# Patient Record
Sex: Female | Born: 1985 | Hispanic: No | Marital: Married | Smoking: Never smoker
Health system: Southern US, Community
[De-identification: ages and names within clinical notes are randomized; demographics above are authoritative.]

## PROBLEM LIST (undated history)

## (undated) DIAGNOSIS — K8020 Calculus of gallbladder without cholecystitis without obstruction: Secondary | ICD-10-CM

## (undated) DIAGNOSIS — K859 Acute pancreatitis without necrosis or infection, unspecified: Secondary | ICD-10-CM

## (undated) HISTORY — DX: Calculus of gallbladder without cholecystitis without obstruction: K80.20

## (undated) HISTORY — DX: Acute pancreatitis without necrosis or infection, unspecified: K85.90

## (undated) HISTORY — PX: REFRACTIVE SURGERY: SHX103

---

## 2008-01-13 DIAGNOSIS — K8020 Calculus of gallbladder without cholecystitis without obstruction: Secondary | ICD-10-CM

## 2008-01-13 DIAGNOSIS — K859 Acute pancreatitis without necrosis or infection, unspecified: Secondary | ICD-10-CM

## 2008-01-13 HISTORY — DX: Calculus of gallbladder without cholecystitis without obstruction: K80.20

## 2008-01-13 HISTORY — DX: Acute pancreatitis without necrosis or infection, unspecified: K85.90

## 2008-01-13 HISTORY — PX: CHOLECYSTECTOMY: SHX55

## 2012-08-27 ENCOUNTER — Encounter: Payer: Self-pay | Admitting: Family Medicine

## 2012-08-27 ENCOUNTER — Ambulatory Visit (INDEPENDENT_AMBULATORY_CARE_PROVIDER_SITE_OTHER): Payer: BC Managed Care – PPO | Admitting: Physician Assistant

## 2012-08-27 VITALS — BP 143/97 | HR 87 | Temp 98.6°F | Resp 16 | Ht 62.5 in | Wt 176.0 lb

## 2012-08-27 DIAGNOSIS — H1010 Acute atopic conjunctivitis, unspecified eye: Secondary | ICD-10-CM

## 2012-08-27 DIAGNOSIS — H1045 Other chronic allergic conjunctivitis: Secondary | ICD-10-CM

## 2012-08-27 MED ORDER — AZELASTINE HCL 0.05 % OP SOLN: 1.0000 [drp] | Freq: Two times a day (BID) | OPHTHALMIC | Status: DC

## 2012-08-27 NOTE — Progress Notes (Signed)
  Subjective:    Patient ID: Deborah Stokes, female    DOB: 1986-04-22, 27 y.o.   MRN: [id number]  HPI 27 yr old female presents with Deborah Stokes, Deborah Stokes X 4 days.  It all started 4 nights ago when her L eye started being Deborah and red.  That has waxed and waned and the last 2 mornings she has awakened to her Stokes being swollen.  She has not used any new soaps, detergents, make up, creams, lotions, etc. She denies pain, but she does have a slight headache.  No vision change.  No FB exposure.  Review of Systems  All other systems reviewed and are negative.       Objective:   Physical Exam  Nursing note and vitals reviewed. Constitutional: She is oriented to person, place, and time. She appears well-developed and well-nourished.  HENT:  Head: Normocephalic and atraumatic.  Right Ear: External ear normal.  Left Ear: External ear normal.  Mouth/Throat: Oropharynx is clear and moist. No oropharyngeal exudate.       No soft tissue swelling of the palate. No preauricular nodes.  Upper eyelids are edematous.  No TTP or sign of orbital cellulitis.  Stokes: EOM are normal. Pupils are equal, round, and reactive to light. Right eye exhibits no discharge. Left eye exhibits no discharge.       Lateral conjunctivae B are erythematous with bulbar and palpebral chemosis.  Fundi benign.  No drainage.  Neck: Normal range of motion. Neck supple.  Cardiovascular: Normal rate and normal heart sounds.   Pulmonary/Chest: Effort normal and breath sounds normal.  Neurological: She is alert and oriented to person, place, and time.  Skin: Skin is warm and dry.  Psychiatric: She has a normal mood and affect. Her behavior is normal.          Assessment & Plan:  Allergic conjunctivitis-unknown allergen.  Start Zyrtec 10mg  1 daily for the next 2 weeks.  Also, start optivar. RTC if not improving in 2 days, sooner if worse.

## 2012-08-27 NOTE — Patient Instructions (Signed)
Cool compresses for comfort

## 2012-10-23 ENCOUNTER — Encounter: Payer: Self-pay | Admitting: Family Medicine

## 2012-10-23 ENCOUNTER — Ambulatory Visit (INDEPENDENT_AMBULATORY_CARE_PROVIDER_SITE_OTHER): Payer: BC Managed Care – PPO | Admitting: Family Medicine

## 2012-10-23 VITALS — BP 124/70 | HR 81 | Temp 98.5°F | Ht 62.25 in | Wt 177.8 lb

## 2012-10-23 DIAGNOSIS — R635 Abnormal weight gain: Secondary | ICD-10-CM

## 2012-10-23 DIAGNOSIS — T7840XA Allergy, unspecified, initial encounter: Secondary | ICD-10-CM

## 2012-10-23 LAB — CBC WITH DIFFERENTIAL/PLATELET
Basophils Absolute: 0 10*3/uL (ref 0.0–0.1)
Eosinophils Absolute: 0.1 10*3/uL (ref 0.0–0.7)
HCT: 41.8 % (ref 36.0–46.0)
Lymphs Abs: 2.3 10*3/uL (ref 0.7–4.0)
MCHC: 34 g/dL (ref 30.0–36.0)
MCV: 87.6 fl (ref 78.0–100.0)
Monocytes Absolute: 0.3 10*3/uL (ref 0.1–1.0)
Neutrophils Relative %: 60.9 % (ref 43.0–77.0)
Platelets: 305 10*3/uL (ref 150.0–400.0)
RDW: 12.5 % (ref 11.5–14.6)

## 2012-10-23 LAB — BASIC METABOLIC PANEL
Calcium: 9.1 mg/dL (ref 8.4–10.5)
GFR: 86.69 mL/min (ref >60.00)
Glucose, Bld: 95 mg/dL (ref 70–99)
Potassium: 4 mEq/L (ref 3.5–5.1)
Sodium: 136 mEq/L (ref 135–145)

## 2012-10-23 LAB — HEPATIC FUNCTION PANEL
Albumin: 3.9 g/dL (ref 3.5–5.2)
Alkaline Phosphatase: 44 U/L (ref 39–117)
Total Bilirubin: 0.5 mg/dL (ref 0.3–1.2)

## 2012-10-23 LAB — LIPID PANEL
HDL: 38.1 mg/dL — ABNORMAL LOW (ref >39.00)
Triglycerides: 182 mg/dL — ABNORMAL HIGH (ref 0.0–149.0)
VLDL: 36.4 mg/dL (ref 0.0–40.0)

## 2012-10-23 NOTE — Progress Notes (Signed)
  Subjective:    Patient ID: Deborah Stokes, female    DOB: 01-Feb-1986, 27 y.o.   MRN: [id number]  HPI New to establish.  Previous PCP- Dr Bryson Ha Iona Hansen)  Weight gain- pt reports that her weight continues to climb despite exercising.  Is attempting to eat healthy.  Maternal aunt w/ thyroid problem.  Exercising 4 days/week for 1 hr.  Drinking smoothie every morning, eating out daily for lunch, dinner is very inconsistent- either going out or eating lunch leftovers.  Sleeping well.  Denies increased stress recently.  Hives- had jello shot in Oct and 30 minutes after developed hives on arms, 2 days later had hives on arms, chest, face, thighs.  Eyes had swollen shut.  Ended up at [facility] to get a steroid shot.  dx'd w/ 'severe allergic rxn'.  January was in a meeting and had excessive tearing of eye w/ swelling.  Was dx'd w/ allergic conjunctivitis and started on Zyrtec and steroid eye drops.  sxs improved.  Denies change in makeup, lotion, soap, other facial products.  No new or different foods.   Review of Systems For ROS see HPI     Objective:   Physical Exam  Vitals reviewed. Constitutional: She is oriented to person, place, and time. She appears well-developed and well-nourished. No distress.  HENT:  Head: Normocephalic and atraumatic.  Eyes: Conjunctivae and EOM are normal. Pupils are equal, round, and reactive to light.  Neck: Normal range of motion. Neck supple. No thyromegaly present.  Cardiovascular: Normal rate, regular rhythm, normal heart sounds and intact distal pulses.   No murmur heard. Pulmonary/Chest: Effort normal and breath sounds normal. No respiratory distress.  Abdominal: Soft. She exhibits no distension. There is no tenderness.  Musculoskeletal: She exhibits no edema.  Lymphadenopathy:    She has no cervical adenopathy.  Neurological: She is alert and oriented to person, place, and time.  Skin: Skin is warm and dry.  Psychiatric: She has a normal mood and affect. Her  behavior is normal.          Assessment & Plan:

## 2012-10-23 NOTE — Patient Instructions (Addendum)
Schedule your complete physical w/ pap at your convenience We'll notify you of your lab results and make any changes if needed Keep up the good work on regular exercise Try and make healthy food choices- this will take some advanced planning Consider weight watchers or [place] to help hold you accountable in your eating [place] [employer], the [employer] (multiple locations), [employer] all have adult sports leagues We'll call you with your allergy appointment Make sure you have Benadryl on hand in case of a reaction Call with any questions or concerns Think of [place] as your home base Welcome!  We're glad to have you!!!

## 2012-10-27 NOTE — Assessment & Plan Note (Signed)
New.  Pt exercising regularly but very poor eating habits.  Encouraged use of smart phone apps to track intake and do a better job of planning meals to avoid grabbing take out.  Check labs to r/o thyroid abnormality and risk stratify.  Pt expressed understanding and is in agreement w/ plan.

## 2012-10-27 NOTE — Assessment & Plan Note (Addendum)
New.  Pt w/out current sxs or hives but due to severity of rxn will refer to allergy for complete evaluation and likely skin testing.  Encouraged pt to always have benadryl on hand.  Zyrtec/claritin daily.  Will follow.

## 2012-10-28 LAB — VITAMIN D 1,25 DIHYDROXY: Vitamin D 1, 25 (OH)2 Total: 77 pg/mL — ABNORMAL HIGH (ref 18–72)

## 2012-11-05 ENCOUNTER — Telehealth: Payer: Self-pay | Admitting: Family Medicine

## 2012-11-05 NOTE — Telephone Encounter (Signed)
Pt returned your call.  

## 2012-11-05 NOTE — Telephone Encounter (Signed)
Patient states just received a bill from [facility] for a Vit D level. She states she was not aware we were testing for this and she does not know why we would have done this. Patient would like a call back.

## 2012-11-28 ENCOUNTER — Encounter: Payer: Self-pay | Admitting: Family Medicine

## 2012-12-26 ENCOUNTER — Encounter: Payer: Self-pay | Admitting: Family Medicine

## 2013-01-30 ENCOUNTER — Other Ambulatory Visit (HOSPITAL_COMMUNITY)
Admission: RE | Admit: 2013-01-30 | Discharge: 2013-01-30 | Disposition: A | Discharge status: Home / Self Care | Payer: BC Managed Care – PPO | Source: Ambulatory Visit | Attending: Family Medicine | Admitting: Family Medicine

## 2013-01-30 ENCOUNTER — Ambulatory Visit (INDEPENDENT_AMBULATORY_CARE_PROVIDER_SITE_OTHER): Payer: BC Managed Care – PPO | Admitting: Family Medicine

## 2013-01-30 ENCOUNTER — Encounter: Payer: Self-pay | Admitting: Family Medicine

## 2013-01-30 VITALS — BP 148/90 | HR 88 | Temp 98.6°F | Ht 62.25 in | Wt 182.8 lb

## 2013-01-30 DIAGNOSIS — R109 Unspecified abdominal pain: Secondary | ICD-10-CM

## 2013-01-30 DIAGNOSIS — Z01419 Encounter for gynecological examination (general) (routine) without abnormal findings: Secondary | ICD-10-CM | POA: Insufficient documentation

## 2013-01-30 DIAGNOSIS — Z1151 Encounter for screening for human papillomavirus (HPV): Secondary | ICD-10-CM | POA: Insufficient documentation

## 2013-01-30 DIAGNOSIS — R1084 Generalized abdominal pain: Secondary | ICD-10-CM | POA: Insufficient documentation

## 2013-01-30 DIAGNOSIS — Z124 Encounter for screening for malignant neoplasm of cervix: Secondary | ICD-10-CM | POA: Insufficient documentation

## 2013-01-30 DIAGNOSIS — Z9889 Other specified postprocedural states: Secondary | ICD-10-CM

## 2013-01-30 NOTE — Patient Instructions (Addendum)
Follow up in 1 year or as needed Consider weight watchers or similar diet program We'll call you with your GI appt for the abdominal pain Call with any questions or concerns Have a great summer!!!

## 2013-01-30 NOTE — Progress Notes (Signed)
  Subjective:    Patient ID: Cy Blamer, female    DOB: 20-Apr-1986, 27 y.o.   MRN: [id number]  HPI CPE- due for pap.  abd pain- occurs after eating.  Will lead to diarrhea.  S/p cholecystectomy due to gallstone pancreatitis.  Doesn't depend on type of food eaten.     Review of Systems Patient reports no vision/ hearing changes, adenopathy, fever, weight change,  persistant/recurrent hoarseness , swallowing issues, chest pain, palpitations, edema, persistant/recurrent cough, hemoptysis, dyspnea (rest/exertional/paroxysmal nocturnal), gastrointestinal bleeding (melena, rectal bleeding), significant heartburn, bowel changes, GU symptoms (dysuria, hematuria, incontinence), Gyn symptoms (abnormal  bleeding, pain),  syncope, focal weakness, memory loss, numbness & tingling, skin/hair/nail changes, abnormal bruising or bleeding, anxiety, or depression.     Objective:   Physical Exam  General Appearance:    Alert, cooperative, no distress, appears stated age  Head:    Normocephalic, without obvious abnormality, atraumatic  Eyes:    PERRL, conjunctiva/corneas clear, EOM's intact, fundi    benign, both eyes  Ears:    Normal TM's and external ear canals, both ears  Nose:   Nares normal, septum midline, mucosa normal, no drainage    or sinus tenderness  Throat:   Lips, mucosa, and tongue normal; teeth and gums normal  Neck:   Supple, symmetrical, trachea midline, no adenopathy;    Thyroid: no enlargement/tenderness/nodules  Back:     Symmetric, no curvature, ROM normal, no CVA tenderness  Lungs:     Clear to auscultation bilaterally, respirations unlabored  Chest Wall:    No tenderness or deformity   Heart:    Regular rate and rhythm, S1 and S2 normal, no murmur, rub   or gallop  Breast Exam:    No tenderness, masses, or nipple abnormality  Abdomen:     Soft, non-tender, bowel sounds active all four quadrants,    no masses, no organomegaly  Genitalia:    External genitalia normal, cervix normal  in appearance, no CMT, uterus in normal size and position, adnexa w/out mass or tenderness, mucosa pink and moist, no lesions or discharge present  Rectal:    Normal external appearance  Extremities:   Extremities normal, atraumatic, no cyanosis or edema  Pulses:   2+ and symmetric all extremities  Skin:   Skin color, texture, turgor normal, no rashes or lesions  Lymph nodes:   Cervical, supraclavicular, and axillary nodes normal  Neurologic:   CNII-XII intact, normal strength, sensation and reflexes    throughout          Assessment & Plan:

## 2013-01-31 ENCOUNTER — Encounter: Payer: Self-pay | Admitting: Gastroenterology

## 2013-01-31 NOTE — Assessment & Plan Note (Signed)
New to provider, ongoing for pt.  Very bothersome.  Occurs w/ eating but no specific triggers.  Has not seen GI.  Already has had gallbladder removed.  Refer to GI for complete eval and tx.  Pt expressed understanding and is in agreement w/ plan.

## 2013-01-31 NOTE — Assessment & Plan Note (Signed)
Pap collected. 

## 2013-01-31 NOTE — Assessment & Plan Note (Signed)
Pt's PE WNL w/ exception of obesity.  Reviewed labs from last visit.  Encouraged weight loss.  Anticipatory guidance provided.

## 2013-02-04 ENCOUNTER — Encounter: Payer: Self-pay | Admitting: *Deleted

## 2013-02-06 ENCOUNTER — Encounter: Payer: Self-pay | Admitting: *Deleted

## 2013-02-24 ENCOUNTER — Ambulatory Visit: Payer: BC Managed Care – PPO | Admitting: Gastroenterology

## 2013-03-03 ENCOUNTER — Encounter: Payer: Self-pay | Admitting: Internal Medicine

## 2013-03-06 ENCOUNTER — Other Ambulatory Visit (INDEPENDENT_AMBULATORY_CARE_PROVIDER_SITE_OTHER): Payer: BC Managed Care – PPO

## 2013-03-06 ENCOUNTER — Encounter: Payer: Self-pay | Admitting: Internal Medicine

## 2013-03-06 ENCOUNTER — Ambulatory Visit (INDEPENDENT_AMBULATORY_CARE_PROVIDER_SITE_OTHER): Payer: BC Managed Care – PPO | Admitting: Internal Medicine

## 2013-03-06 VITALS — BP 126/98 | HR 84 | Ht 62.0 in | Wt 180.0 lb

## 2013-03-06 DIAGNOSIS — R195 Other fecal abnormalities: Secondary | ICD-10-CM

## 2013-03-06 DIAGNOSIS — K589 Irritable bowel syndrome without diarrhea: Secondary | ICD-10-CM

## 2013-03-06 LAB — IGA: IgA: 260 mg/dL (ref 68–378)

## 2013-03-06 MED ORDER — LACTASE 3000 UNITS PO TABS: 1.0000 | ORAL_TABLET | Freq: Three times a day (TID) | ORAL | Status: DC

## 2013-03-06 MED ORDER — RESTORA PO CAPS: 1.0000 | ORAL_CAPSULE | Freq: Every day | ORAL | Status: DC

## 2013-03-06 MED ORDER — HYOSCYAMINE SULFATE 0.125 MG SL SUBL: 0.1250 mg | SUBLINGUAL_TABLET | SUBLINGUAL | Status: DC | PRN

## 2013-03-06 NOTE — Patient Instructions (Addendum)
Your physician has requested that you go to the basement for the following lab work before leaving today: Celiac panel  We have sent the following medications to your pharmacy for you to pick up at your convenience: Levsin as needed for pain. You can purchase lactaid over the counter to take with every meal that contains lactose.   We have given you samples of Restora. This puts good bacteria back into your intestines. You should take 1 capsule by mouth once daily. If this works well for you, it can be purchased over the counter.  Dr. Rhea Belton recommends you keep a food diary and follow up with him in office in 4-6  Weeks                                               We are excited to introduce MyChart, a new best-in-class service that provides you online access to important information in your electronic medical record. We want to make it easier for you to view your health information - all in one secure location - when and where you need it. We expect MyChart will enhance the quality of care and service we provide.  When you register for MyChart, you can:    View your test results.    Request appointments and receive appointment reminders via email.    Request medication renewals.    View your medical history, allergies, medications and immunizations.    Communicate with your physician's office through a password-protected site.    Conveniently print information such as your medication lists.  To find out if MyChart is right for you, please talk to a member of our clinical staff today. We will gladly answer your questions about this free health and wellness tool.  If you are age 50 or older and want a member of your family to have access to your record, you must provide written consent by completing a proxy form available at our office. Please speak to our clinical staff about guidelines regarding accounts for patients younger than age 49.  As you activate your MyChart account and need  any technical assistance, please call the MyChart technical support line at [phone]) or email your question to [email or web address]. If you email your question(s), please include your name, a return phone number and the best time to reach you.  If you have non-urgent health-related questions, you can send a message to our office through MyChart at [email or web address]. If you have a medical emergency, call 911.  Thank you for using MyChart as your new health and wellness resource!   MyChart licensed from [employer],  [id number]. Patents Pending.

## 2013-03-06 NOTE — Progress Notes (Signed)
Patient ID: Deborah Stokes, female   DOB: Sep 02, 1985, 27 y.o.   MRN: [id number] HPI: Deborah Stokes is a 27 year old female with a past medical history of gallstones, kbyte gallstone pancreatitis status post ERCP and cholecystectomy in 2009 in [place] who is seen in consultation at the request of Dr. Beverely Low for evaluation of postprandial loose stools and abdominal pain. The patient is alone today. She reports long-standing history of postprandial loose stools, oftentimes urgent. She has mid abdominal discomfort which is relieved defecation. She reports this is been present for 10 years or "as long as I can remember". This tends to be worse with stress. She knows that certain foods such as milk can make her symptoms worse, but also greasy foods such as pizza can worsen her symptoms. She does not have nausea or vomiting. She does not have heartburn. She's had no blood in her stool or melena. She usually has bowel movements after eating on average 2-3 times a day but can occur 5-6 times per day. She reports this is somewhat troublesome at work and can be embarrassing. No weight loss. No family history of celiac disease, IBD, or GI tract malignancy. She is not currently taking any prescription medications and she doesn't take any over-the-counter meds either  Past Medical History  Diagnosis Date  . Gallstones 01/2008  . Pancreatitis 01/2008    Past Surgical History  Procedure Laterality Date  . Refractive surgery Bilateral   . Cholecystectomy  01/2008    Current Outpatient Prescriptions  Medication Sig Dispense Refill  . hyoscyamine (LEVSIN SL) 0.125 MG SL tablet Place 1 tablet (0.125 mg total) under the tongue every 4 (four) hours as needed for cramping.  30 tablet  0  . lactase (LACTAID) 3000 UNITS tablet Take 1 tablet (3,000 Units total) by mouth 3 (three) times daily with meals.      . Probiotic Product (RESTORA) CAPS Take 1 capsule by mouth daily.       No current facility-administered  medications for this visit.    No Known Allergies  Family History  Problem Relation Age of Onset  . Throat cancer Maternal Grandfather   . Hypertension Mother   . Hypertension Father   . Arthritis Maternal Grandmother   . Stroke Maternal Grandmother   . Hypertension Maternal Grandmother   . Diabetes Maternal Grandmother     History  Substance Use Topics  . Smoking status: Never Smoker   . Smokeless tobacco: Never Used  . 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 No distress. HEENT: Normocephalic and atraumatic. Oropharynx is clear and moist. No oropharyngeal exudate. Conjunctivae are normal.  No scleral icterus. Neck: Neck supple. Trachea midline. Cardiovascular: Normal rate, regular rhythm and intact distal pulses. No M/R/G Pulmonary/chest: Effort normal and breath sounds normal. No wheezing, rales or rhonchi. Abdominal: Soft, nontender, nondistended. Bowel sounds active throughout. There are no masses palpable. No hepatosplenomegaly. Extremities: no clubbing, cyanosis, or edema Lymphadenopathy: No cervical adenopathy noted. Neurological: Alert and oriented to person place and time. Skin: Skin is warm and dry. No rashes noted. Psychiatric: Normal mood and affect. Behavior is normal.  RELEVANT LABS AND IMAGING: CBC  Component Value Date/Time   WBC 7.1 10/23/2012 0908   RBC 4.77 10/23/2012 0908   HGB 14.2 10/23/2012 0908   HCT 41.8 10/23/2012 0908   PLT 305.0 10/23/2012 0908   MCV 87.6 10/23/2012 0908   MCHC 34.0 10/23/2012 0908   RDW 12.5 10/23/2012 0908   LYMPHSABS 2.3 10/23/2012 0908   MONOABS 0.3 10/23/2012 0908   EOSABS 0.1 10/23/2012 0908   BASOSABS 0.0 10/23/2012 0908     CMP     Component Value Date/Time   NA 136 10/23/2012 0908   K 4.0 10/23/2012 0908   CL 106 10/23/2012 0908   CO2 24 10/23/2012 0908   GLUCOSE 95 10/23/2012 0908   BUN 15 10/23/2012 0908   CREATININE 0.8 10/23/2012 0908   CALCIUM 9.1 10/23/2012 0908   PROT 7.5 10/23/2012 0908   ALBUMIN 3.9 10/23/2012 0908   AST 19 10/23/2012 0908   ALT 41* 10/23/2012 0908   ALKPHOS 44 10/23/2012 0908   BILITOT 0.5 10/23/2012 0908   TSH - normal  ASSESSMENT/PLAN: 27 year old female with a past medical history of gallstones, kbyte gallstone pancreatitis status post ERCP and cholecystectomy in 2009 in [place] who is seen in consultation at the request of Dr. Beverely Low for evaluation of postprandial loose stools and abdominal pain.   1.  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 We discussed this today. Previously her blood count, metabolic panel, and thyroid function labs were normal. I would like to check a celiac panel today. I would like her to keep a food diary to try to determine which foods trigger her symptoms most. It sounds like she may have a lactose intolerance and I have recommended over-the-counter Lactaid one to 2 tablets before a lactose containing meal. I will have her start probiotic (Restora) and also Benefiber to help bulk her stools. Finally I will give her prescription for Levsin to be used as needed and as directed for crampy abdominal pain.  I like to see her back in 4-6 weeks to assess her response. She may be a candidate for Lotronex.  We will discuss this at followup if not improving.  Endoscopy is not felt to be indicated nor helpful at this time given her lack of dyspeptic, reflux symptoms

## 2013-03-10 ENCOUNTER — Telehealth: Payer: Self-pay | Admitting: Gastroenterology

## 2013-03-10 NOTE — Telephone Encounter (Signed)
Message copied by Richardo Hanks on Mon Mar 10, 2013  9:21 AM ------      Message from: Beverley Fiedler      Created: Sun Mar 09, 2013 11:05 PM       Celiac panel is negative, so celiac disease very very unlikely ------

## 2013-03-10 NOTE — Telephone Encounter (Signed)
lvm for pt to call me back for lab results 

## 2013-03-10 NOTE — Telephone Encounter (Signed)
Message copied by Richardo Hanks on Mon Mar 10, 2013  1:45 PM ------      Message from: Mckinley Jewel, AMY L      Created: Mon Mar 10, 2013 12:25 PM       Pt said you can reach her at same number and you are welcome to leave a msg on her vm ------

## 2013-03-10 NOTE — Telephone Encounter (Signed)
lvm for pt telling her celiac panel came back negative, and per Dr. Rhea Belton celiac disease is very unlikely. She can call me back if she has any questions.

## 2013-09-05 ENCOUNTER — Encounter: Payer: Self-pay | Admitting: Family Medicine

## 2013-09-05 ENCOUNTER — Ambulatory Visit (INDEPENDENT_AMBULATORY_CARE_PROVIDER_SITE_OTHER): Payer: BC Managed Care – PPO | Admitting: Family Medicine

## 2013-09-05 VITALS — BP 120/86 | HR 88 | Temp 98.3°F | Resp 16 | Wt 181.0 lb

## 2013-09-05 DIAGNOSIS — K219 Gastro-esophageal reflux disease without esophagitis: Secondary | ICD-10-CM | POA: Insufficient documentation

## 2013-09-05 DIAGNOSIS — J309 Allergic rhinitis, unspecified: Secondary | ICD-10-CM

## 2013-09-05 MED ORDER — OMEPRAZOLE 20 MG PO CPDR: 20.0000 mg | DELAYED_RELEASE_CAPSULE | Freq: Every day | ORAL | Status: AC

## 2013-09-05 NOTE — Patient Instructions (Signed)
Follow up as needed Start the Omeprazole 20mg  daily to decrease the acid reflux Start the Zyrtec daily to decrease the post-nasal Drink plenty of fluids Call with any questions or concerns Hang in there!!!

## 2013-09-05 NOTE — Progress Notes (Signed)
Pre visit review using our clinic review tool, if applicable. No additional management support is needed unless otherwise documented below in the visit note. 

## 2013-09-05 NOTE — Assessment & Plan Note (Signed)
New.  Pt is experiencing sour brash and ongoing cough.  Start low dose PPI and see if both sxs improve.  Pt expressed understanding and is in agreement w/ plan.

## 2013-09-05 NOTE — Assessment & Plan Note (Signed)
New.  Likely contributing to pt's ongoing cough.  Start OTC antihistamine.  Reviewed supportive care and red flags that should prompt return.  Pt expressed understanding and is in agreement w/ plan.

## 2013-09-05 NOTE — Progress Notes (Signed)
   Subjective:    Patient ID: Deborah Stokes, female    DOB: [birth year], 28 y.o.   MRN: [id number]  HPI URI- 'lingering nasal stuff and cough since December'.  Having sore throat in AM.  Cough is dry.  Will occasionally have sour brash taste.  No abd pain.  Some sinus pressure and ear discomfort.  No fevers.  No known sick contacts.   Review of Systems For ROS see HPI     Objective:   Physical Exam  Vitals reviewed. Constitutional: She appears well-developed and well-nourished. No distress.  HENT:  Head: Normocephalic and atraumatic.  Right Ear: Tympanic membrane normal.  Left Ear: Tympanic membrane normal.  Nose: Mucosal edema and rhinorrhea present. Right sinus exhibits no maxillary sinus tenderness and no frontal sinus tenderness. Left sinus exhibits no maxillary sinus tenderness and no frontal sinus tenderness.  Mouth/Throat: Mucous membranes are normal. Posterior oropharyngeal erythema (w/ PND) present.  Eyes: Conjunctivae and EOM are normal. Pupils are equal, round, and reactive to light.  Neck: Normal range of motion. Neck supple.  Cardiovascular: Normal rate, regular rhythm and normal heart sounds.   Pulmonary/Chest: Effort normal and breath sounds normal. No respiratory distress. She has no wheezes. She has no rales.  Lymphadenopathy:    She has no cervical adenopathy.          Assessment & Plan:

## 2013-10-06 ENCOUNTER — Ambulatory Visit (INDEPENDENT_AMBULATORY_CARE_PROVIDER_SITE_OTHER): Payer: BC Managed Care – PPO | Admitting: Physician Assistant

## 2013-10-06 ENCOUNTER — Encounter: Payer: Self-pay | Admitting: Physician Assistant

## 2013-10-06 VITALS — BP 124/77 | HR 103 | Temp 98.5°F | Wt 186.0 lb

## 2013-10-06 DIAGNOSIS — B9789 Other viral agents as the cause of diseases classified elsewhere: Principal | ICD-10-CM

## 2013-10-06 DIAGNOSIS — J069 Acute upper respiratory infection, unspecified: Secondary | ICD-10-CM

## 2013-10-06 MED ORDER — HYDROCOD POLST-CHLORPHEN POLST 10-8 MG/5ML PO LQCR: 5.0000 mL | Freq: Two times a day (BID) | ORAL | Status: DC | PRN

## 2013-10-06 NOTE — Assessment & Plan Note (Signed)
Rx Tussionex. Increase fluid intake.  Rest.  Saline nasal spray. Mucinex. Humidifier in bedroom.  Please call or return to clinic if symptoms do not continue to improve over the next 1-2 days.

## 2013-10-06 NOTE — Progress Notes (Signed)
Patient presents to clinic today c/o 3 days of nasal congestion, cough, headache, scratchy throat and post-nasal drip.  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  Patient states she sat next to a woman who was sick and coughing all day.  Patient states Saturday morning she woke up with a mild cough and scratchy throat.  Late Saturday night, patient endorses running a low-grade fever.  Sunday she suffered with body aches and continued cough.  Endorses fever of 100.2 that went away with medication.  Patient currently has mild, scratchy throat and dry cough but states her symptoms are much improved from yesterday.  Denies fever, chills, aches, difficulty swallowing, sob or wheezing.  Denies sinus pain, ear pain or tooth pain.    Past Medical History  Diagnosis Date  . Gallstones 01/2008  . Pancreatitis 01/2008    Current Outpatient Prescriptions on File Prior to Visit  Medication Sig Dispense Refill  . omeprazole (PRILOSEC) 20 MG capsule Take 1 capsule (20 mg total) by mouth daily.  30 capsule  3   No current facility-administered medications on file prior to visit.    No Known Allergies  Family History  Problem Relation Age of Onset  . Throat cancer Maternal Grandfather   . Hypertension Mother   . Hypertension Father   . Arthritis Maternal Grandmother   . Stroke Maternal Grandmother   . Hypertension Maternal Grandmother   . Diabetes Maternal Grandmother     History   Social History  . Marital Status: Unknown    Spouse Name: N/A    Number of Children: 0  . Years of Education: N/A   Occupational History  . clinical counselor [employer]   Social History Main Topics  . Smoking status: Never Smoker   . Smokeless tobacco: Never Used  . Alcohol Use: Yes     Comment: not very much  . Drug Use: No  . Sexual Activity: Yes    Birth Control/ Protection: Condom   Other Topics Concern  . None   Social History  Narrative   ** Merged History Encounter **       Review of Systems - See HPI.  All other ROS are negative.  BP 124/77  Pulse 103  Temp(Src) 98.5 F (36.9 C) (Oral)  Wt 186 lb (84.369 kg)  SpO2 97%  LMP 10/03/2013  Physical Exam  Vitals reviewed. Constitutional: She is oriented to person, place, and time and well-developed, well-nourished, and in no distress.  HENT:  Head: Normocephalic and atraumatic.  Right Ear: Tympanic membrane and external ear normal.  Left Ear: Tympanic membrane and external ear normal.  Nose: Nose normal.  Mouth/Throat: Uvula is midline, oropharynx is clear and moist and mucous membranes are normal. No oropharyngeal exudate, posterior oropharyngeal edema, posterior oropharyngeal erythema or tonsillar abscesses.  Eyes: Conjunctivae are normal. Pupils are equal, round, and reactive to light.  Neck: Neck supple.  Cardiovascular: Normal rate, regular rhythm, normal heart sounds and intact distal pulses.   Pulmonary/Chest: Effort normal and breath sounds normal. No respiratory distress. She has no wheezes. She has no rales. She exhibits no tenderness.  Lymphadenopathy:    She has no cervical adenopathy.  Neurological: She is alert and oriented to person, place, and time.  Skin: Skin is warm and dry. No rash noted.  Psychiatric: Affect normal.   Assessment/Plan: Viral URI with cough Rx Tussionex. Increase fluid intake.  Rest.  Saline nasal spray. Mucinex. Humidifier  in bedroom.  Please call or return to clinic if symptoms do not continue to improve over the next 1-2 days.

## 2013-10-06 NOTE — Patient Instructions (Signed)
Please increase fluid intake.  Rest.  Use saline nasal spray.  Put a humidifier in the bedroom.  Please take tylenol as needed if fever returns.  Continue Mucinex.  Call or return to clinic if symptoms are not improving over the next 2 days.  Viral Infections A virus is a type of germ. Viruses can cause:  Minor sore throats.  Aches and pains.  Headaches.  Runny nose.  Rashes.  Watery eyes.  Tiredness.  Coughs.  Loss of appetite.  Feeling sick to your stomach (nausea).  Throwing up (vomiting).  Watery poop (diarrhea). HOME CARE   Only take medicines as told by your doctor.  Drink enough water and fluids to keep your pee (urine) clear or pale yellow. Sports drinks are a good choice.  Get plenty of rest and eat healthy. Soups and broths with crackers or rice are fine. GET HELP RIGHT AWAY IF:   You have a very bad headache.  You have shortness of breath.  You have chest pain or neck pain.  You have an unusual rash.  You cannot stop throwing up.  You have watery poop that does not stop.  You cannot keep fluids down.  You or your child has a temperature by mouth above 102 F (38.9 C), not controlled by medicine.  Your baby is older than 3 months with a rectal temperature of 102 F (38.9 C) or higher.  Your baby is 683 months old or younger with a rectal temperature of 100.4 F (38 C) or higher. MAKE SURE YOU:   Understand these instructions.  Will watch this condition.  Will get help right away if you are not doing well or get worse. Document Released: 07/13/2008 Document Revised: 10/23/2011 Document Reviewed: 12/06/2010 [facility] Patient Information 2014 [place], [place].

## 2013-11-07 ENCOUNTER — Encounter: Payer: Self-pay | Admitting: Family Medicine

## 2013-11-07 ENCOUNTER — Ambulatory Visit (INDEPENDENT_AMBULATORY_CARE_PROVIDER_SITE_OTHER): Payer: BC Managed Care – PPO | Admitting: Family Medicine

## 2013-11-07 VITALS — BP 110/74 | HR 92 | Temp 98.4°F | Resp 16 | Wt 181.2 lb

## 2013-11-07 DIAGNOSIS — Z3040 Encounter for surveillance of contraceptives, unspecified: Secondary | ICD-10-CM

## 2013-11-07 DIAGNOSIS — Z309 Encounter for contraceptive management, unspecified: Secondary | ICD-10-CM

## 2013-11-07 DIAGNOSIS — IMO0001 Reserved for inherently not codable concepts without codable children: Secondary | ICD-10-CM

## 2013-11-07 LAB — POCT URINE PREGNANCY: Preg Test, Ur: NEGATIVE

## 2013-11-07 MED ORDER — NORGESTIMATE-ETH ESTRADIOL 0.25-35 MG-MCG PO TABS: 1.0000 | ORAL_TABLET | Freq: Every day | ORAL | Status: DC

## 2013-11-07 NOTE — Patient Instructions (Signed)
Schedule your complete physical for this summer Start the birth control on Sunday It will be totally up to you if you want to skip your period in May Call with any questions or concerns CONGRATS on the wedding!!!  You will be a beautiful bride!

## 2013-11-07 NOTE — Progress Notes (Signed)
Pre visit review using our clinic review tool, if applicable. No additional management support is needed unless otherwise documented below in the visit note. 

## 2013-11-07 NOTE — Progress Notes (Signed)
   Subjective:    Patient ID: Deborah Stokes, female    DOB: March 13, 1986, 28 y.o.   MRN: [id number]  HPI Birth control- cycle just ended, Upreg negative.  Pt has wedding upcoming and is wanting to get on birth control to delay period but doesn't know if this is possible.   Review of Systems For ROS see HPI     Objective:   Physical Exam  Vitals reviewed. Constitutional: She is oriented to person, place, and time. She appears well-developed and well-nourished. No distress.  Neurological: She is alert and oriented to person, place, and time.  Skin: Skin is warm and dry.  Psychiatric: She has a normal mood and affect. Her behavior is normal. Thought content normal.          Assessment & Plan:

## 2013-11-07 NOTE — Assessment & Plan Note (Signed)
New.  Reviewed appropriate start date w/ pt (Sunday) and how to take pills if she desires to skip her May period due to her wedding.  Reviewed possible side effects.  Pt expressed understanding and is in agreement w/ plan.

## 2013-11-25 ENCOUNTER — Ambulatory Visit (INDEPENDENT_AMBULATORY_CARE_PROVIDER_SITE_OTHER): Payer: BC Managed Care – PPO | Admitting: Psychology

## 2013-11-25 DIAGNOSIS — F411 Generalized anxiety disorder: Secondary | ICD-10-CM

## 2013-11-27 ENCOUNTER — Ambulatory Visit: Payer: BC Managed Care – PPO | Admitting: Psychology

## 2013-12-04 ENCOUNTER — Telehealth: Payer: Self-pay | Admitting: Family Medicine

## 2013-12-04 MED ORDER — NORGESTIMATE-ETH ESTRADIOL 0.25-35 MG-MCG PO TABS: 1.0000 | ORAL_TABLET | Freq: Every day | ORAL | Status: DC

## 2013-12-04 NOTE — Telephone Encounter (Signed)
Pt needs the next two packages of her birth control pills due to her going out of town.  New script sent to [facility] [address].  Patient made aware. No further questions or concerns voiced.

## 2013-12-04 NOTE — Telephone Encounter (Signed)
Patient called and requested a refill for norgestimate-ethinyl estradiol (ORTHO-CYCLEN,SPRINTEC,PREVIFEM) 0.25-35 MG-MCG tablet a 90 day supply.  [facility] [address]

## 2013-12-18 ENCOUNTER — Ambulatory Visit (INDEPENDENT_AMBULATORY_CARE_PROVIDER_SITE_OTHER): Payer: BC Managed Care – PPO | Admitting: Psychology

## 2013-12-18 DIAGNOSIS — F411 Generalized anxiety disorder: Secondary | ICD-10-CM

## 2014-02-05 ENCOUNTER — Encounter: Payer: BC Managed Care – PPO | Admitting: Family Medicine

## 2014-05-21 ENCOUNTER — Encounter: Payer: Self-pay | Admitting: Family Medicine

## 2014-05-21 ENCOUNTER — Ambulatory Visit (INDEPENDENT_AMBULATORY_CARE_PROVIDER_SITE_OTHER): Payer: BC Managed Care – PPO | Admitting: Family Medicine

## 2014-05-21 VITALS — BP 130/80 | HR 97 | Temp 98.2°F | Resp 16 | Ht 62.5 in | Wt 182.1 lb

## 2014-05-21 DIAGNOSIS — Z01419 Encounter for gynecological examination (general) (routine) without abnormal findings: Secondary | ICD-10-CM

## 2014-05-21 DIAGNOSIS — Z23 Encounter for immunization: Secondary | ICD-10-CM

## 2014-05-21 DIAGNOSIS — E669 Obesity, unspecified: Secondary | ICD-10-CM | POA: Insufficient documentation

## 2014-05-21 NOTE — Assessment & Plan Note (Signed)
Pt's PE WNL w/ exception of obesity.  UTD on pap.  Stressed need for healthy diet and regular exercise.  Check labs.  Anticipatory guidance provided.

## 2014-05-21 NOTE — Progress Notes (Signed)
   Subjective:    Patient ID: Deborah Stokes, female    DOB: [birth year], 28 y.o.   MRN: [id number]  HPI CPE- UTD on pap.  No concerns.     Review of Systems Patient reports no vision/ hearing changes, adenopathy,fever, weight change,  persistant/recurrent hoarseness , swallowing issues, chest pain, palpitations, edema, persistant/recurrent cough, hemoptysis, dyspnea (rest/exertional/paroxysmal nocturnal), gastrointestinal bleeding (melena, rectal bleeding), abdominal pain, significant heartburn, bowel changes, GU symptoms (dysuria, hematuria, incontinence), Gyn symptoms (abnormal  bleeding, pain),  syncope, focal weakness, memory loss, numbness & tingling, skin/hair/nail changes, abnormal bruising or bleeding, anxiety, or depression.     Objective:   Physical Exam  General Appearance:    Alert, cooperative, no distress, appears stated age, obese  Head:    Normocephalic, without obvious abnormality, atraumatic  Eyes:    PERRL, conjunctiva/corneas clear, EOM's intact, fundi    benign, both eyes  Ears:    Normal TM's and external ear canals, both ears  Nose:   Nares normal, septum midline, mucosa normal, no drainage    or sinus tenderness  Throat:   Lips, mucosa, and tongue normal; teeth and gums normal  Neck:   Supple, symmetrical, trachea midline, no adenopathy;    Thyroid: no enlargement/tenderness/nodules  Back:     Symmetric, no curvature, ROM normal, no CVA tenderness  Lungs:     Clear to auscultation bilaterally, respirations unlabored  Chest Wall:    No tenderness or deformity   Heart:    Regular rate and rhythm, S1 and S2 normal, no murmur, rub   or gallop  Breast Exam:    No tenderness, masses, or nipple abnormality  Abdomen:     Soft, non-tender, bowel sounds active all four quadrants,    no masses, no organomegaly  Genitalia:    deferred  Rectal:    Extremities:   Extremities normal, atraumatic, no cyanosis or edema  Pulses:   2+ and symmetric all extremities  Skin:    Skin color, texture, turgor normal, no rashes or lesions  Lymph nodes:   Cervical, supraclavicular, and axillary nodes normal  Neurologic:   CNII-XII intact, normal strength, sensation and reflexes    throughout          Assessment & Plan:

## 2014-05-21 NOTE — Progress Notes (Signed)
Pre visit review using our clinic review tool, if applicable. No additional management support is needed unless otherwise documented below in the visit note. 

## 2014-05-21 NOTE — Patient Instructions (Signed)
Follow up in 1 year or as needed We'll notify you of your lab results and make any changes if needed Keep up the good work!  You look great! Call with any questions or concerns CONGRATS ON THE WEDDING!!!

## 2014-05-22 LAB — LDL CHOLESTEROL, DIRECT: Direct LDL: 124.3 mg/dL

## 2014-05-22 LAB — CBC WITH DIFFERENTIAL/PLATELET
BASOS ABS: 0 10*3/uL (ref 0.0–0.1)
BASOS PCT: 0.2 % (ref 0.0–3.0)
Eosinophils Absolute: 0.2 10*3/uL (ref 0.0–0.7)
Eosinophils Relative: 1.5 % (ref 0.0–5.0)
HEMATOCRIT: 43.8 % (ref 36.0–46.0)
HEMOGLOBIN: 14.4 g/dL (ref 12.0–15.0)
LYMPHS ABS: 2.9 10*3/uL (ref 0.7–4.0)
Lymphocytes Relative: 29.5 % (ref 12.0–46.0)
MCHC: 33 g/dL (ref 30.0–36.0)
MCV: 90.2 fl (ref 78.0–100.0)
Monocytes Absolute: 0.2 10*3/uL (ref 0.1–1.0)
Monocytes Relative: 2.2 % — ABNORMAL LOW (ref 3.0–12.0)
NEUTROS ABS: 6.7 10*3/uL (ref 1.4–7.7)
Neutrophils Relative %: 66.6 % (ref 43.0–77.0)
Platelets: 338 10*3/uL (ref 150.0–400.0)
RBC: 4.86 Mil/uL (ref 3.87–5.11)
RDW: 12.2 % (ref 11.5–15.5)
WBC: 10 10*3/uL (ref 4.0–10.5)

## 2014-05-22 LAB — HEPATIC FUNCTION PANEL
ALK PHOS: 43 U/L (ref 39–117)
ALT: 59 U/L — ABNORMAL HIGH (ref 0–35)
AST: 37 U/L (ref 0–37)
Albumin: 3.7 g/dL (ref 3.5–5.2)
BILIRUBIN TOTAL: 1 mg/dL (ref 0.2–1.2)
Bilirubin, Direct: 0.1 mg/dL (ref 0.0–0.3)
Total Protein: 8 g/dL (ref 6.0–8.3)

## 2014-05-22 LAB — LIPID PANEL
CHOL/HDL RATIO: 6
Cholesterol: 210 mg/dL — ABNORMAL HIGH (ref 0–200)
HDL: 36 mg/dL — ABNORMAL LOW (ref >39.00)
NonHDL: 174
Triglycerides: 374 mg/dL — ABNORMAL HIGH (ref 0.0–149.0)
VLDL: 74.8 mg/dL — ABNORMAL HIGH (ref 0.0–40.0)

## 2014-05-22 LAB — TSH: TSH: 2.38 u[IU]/mL (ref 0.35–4.50)

## 2014-05-22 LAB — BASIC METABOLIC PANEL
BUN: 13 mg/dL (ref 6–23)
CO2: 24 mEq/L (ref 19–32)
Calcium: 10.1 mg/dL (ref 8.4–10.5)
Chloride: 104 mEq/L (ref 96–112)
Creatinine, Ser: 0.9 mg/dL (ref 0.4–1.2)
GFR: 77.14 mL/min (ref >60.00)
GLUCOSE: 84 mg/dL (ref 70–99)
POTASSIUM: 4 meq/L (ref 3.5–5.1)
Sodium: 136 mEq/L (ref 135–145)

## 2014-05-22 LAB — VITAMIN D 25 HYDROXY (VIT D DEFICIENCY, FRACTURES): VITD: 23.6 ng/mL — ABNORMAL LOW (ref 30.00–100.00)

## 2014-05-25 ENCOUNTER — Other Ambulatory Visit: Payer: Self-pay | Admitting: Family Medicine

## 2014-05-25 DIAGNOSIS — R945 Abnormal results of liver function studies: Principal | ICD-10-CM

## 2014-05-25 DIAGNOSIS — R7989 Other specified abnormal findings of blood chemistry: Secondary | ICD-10-CM

## 2014-05-25 MED ORDER — VITAMIN D (ERGOCALCIFEROL) 1.25 MG (50000 UNIT) PO CAPS: 50000.0000 [IU] | ORAL_CAPSULE | ORAL | Status: DC

## 2014-06-10 ENCOUNTER — Emergency Department (HOSPITAL_BASED_OUTPATIENT_CLINIC_OR_DEPARTMENT_OTHER): Payer: BC Managed Care – PPO

## 2014-06-10 ENCOUNTER — Emergency Department (HOSPITAL_BASED_OUTPATIENT_CLINIC_OR_DEPARTMENT_OTHER)
Admission: EM | Admit: 2014-06-10 | Discharge: 2014-06-11 | Disposition: A | Discharge status: Home / Self Care | Payer: BC Managed Care – PPO | Attending: Emergency Medicine | Admitting: Emergency Medicine

## 2014-06-10 ENCOUNTER — Encounter (HOSPITAL_BASED_OUTPATIENT_CLINIC_OR_DEPARTMENT_OTHER): Payer: Self-pay | Admitting: Emergency Medicine

## 2014-06-10 DIAGNOSIS — Z79899 Other long term (current) drug therapy: Secondary | ICD-10-CM | POA: Diagnosis not present

## 2014-06-10 DIAGNOSIS — Z3202 Encounter for pregnancy test, result negative: Secondary | ICD-10-CM | POA: Diagnosis not present

## 2014-06-10 DIAGNOSIS — R109 Unspecified abdominal pain: Secondary | ICD-10-CM | POA: Diagnosis present

## 2014-06-10 DIAGNOSIS — N201 Calculus of ureter: Secondary | ICD-10-CM

## 2014-06-10 DIAGNOSIS — Z8719 Personal history of other diseases of the digestive system: Secondary | ICD-10-CM | POA: Diagnosis not present

## 2014-06-10 LAB — COMPREHENSIVE METABOLIC PANEL
ALT: 43 U/L — AB (ref 0–35)
ANION GAP: 18 — AB (ref 5–15)
AST: 38 U/L — ABNORMAL HIGH (ref 0–37)
Albumin: 4.1 g/dL (ref 3.5–5.2)
Alkaline Phosphatase: 59 U/L (ref 39–117)
BUN: 16 mg/dL (ref 6–23)
CO2: 23 meq/L (ref 19–32)
CREATININE: 1.2 mg/dL — AB (ref 0.50–1.10)
Calcium: 10 mg/dL (ref 8.4–10.5)
Chloride: 101 mEq/L (ref 96–112)
GFR, EST AFRICAN AMERICAN: 71 mL/min — AB (ref >90)
GFR, EST NON AFRICAN AMERICAN: 61 mL/min — AB (ref >90)
Glucose, Bld: 120 mg/dL — ABNORMAL HIGH (ref 70–99)
Potassium: 3.9 mEq/L (ref 3.7–5.3)
Sodium: 142 mEq/L (ref 137–147)
TOTAL PROTEIN: 8.2 g/dL (ref 6.0–8.3)
Total Bilirubin: 0.6 mg/dL (ref 0.3–1.2)

## 2014-06-10 LAB — URINALYSIS, ROUTINE W REFLEX MICROSCOPIC
BILIRUBIN URINE: NEGATIVE
Glucose, UA: NEGATIVE mg/dL
Ketones, ur: NEGATIVE mg/dL
NITRITE: NEGATIVE
PH: 6 (ref 5.0–8.0)
Protein, ur: >300 mg/dL — AB
Specific Gravity, Urine: 1.026 (ref 1.005–1.030)
UROBILINOGEN UA: 1 mg/dL (ref 0.0–1.0)

## 2014-06-10 LAB — URINE MICROSCOPIC-ADD ON

## 2014-06-10 LAB — CBC WITH DIFFERENTIAL/PLATELET
Basophils Absolute: 0 10*3/uL (ref 0.0–0.1)
Basophils Relative: 0 % (ref 0–1)
EOS PCT: 0 % (ref 0–5)
Eosinophils Absolute: 0.1 10*3/uL (ref 0.0–0.7)
HEMATOCRIT: 42.4 % (ref 36.0–46.0)
Hemoglobin: 15.2 g/dL — ABNORMAL HIGH (ref 12.0–15.0)
LYMPHS ABS: 3 10*3/uL (ref 0.7–4.0)
Lymphocytes Relative: 16 % (ref 12–46)
MCH: 30.5 pg (ref 26.0–34.0)
MCHC: 35.8 g/dL (ref 30.0–36.0)
MCV: 85 fL (ref 78.0–100.0)
MONO ABS: 0.6 10*3/uL (ref 0.1–1.0)
Monocytes Relative: 3 % (ref 3–12)
Neutro Abs: 14.6 10*3/uL — ABNORMAL HIGH (ref 1.7–7.7)
Neutrophils Relative %: 81 % — ABNORMAL HIGH (ref 43–77)
Platelets: 340 10*3/uL (ref 150–400)
RBC: 4.99 MIL/uL (ref 3.87–5.11)
RDW: 11.8 % (ref 11.5–15.5)
WBC: 18.2 10*3/uL — AB (ref 4.0–10.5)

## 2014-06-10 LAB — PREGNANCY, URINE: Preg Test, Ur: NEGATIVE

## 2014-06-10 MED ORDER — ONDANSETRON HCL 4 MG/2ML IJ SOLN
4.0000 mg | Freq: Once | INTRAMUSCULAR | Status: AC
  Administered 2014-06-10: 4 mg via INTRAVENOUS
  Filled 2014-06-10: qty 2

## 2014-06-10 MED ORDER — SODIUM CHLORIDE 0.9 % IV BOLUS (SEPSIS)
1000.0000 mL | Freq: Once | INTRAVENOUS | Status: AC
  Administered 2014-06-10: 1000 mL via INTRAVENOUS

## 2014-06-10 MED ORDER — HYDROMORPHONE HCL 1 MG/ML IJ SOLN
1.0000 mg | Freq: Once | INTRAMUSCULAR | Status: AC
  Administered 2014-06-10: 1 mg via INTRAVENOUS
  Filled 2014-06-10: qty 1

## 2014-06-10 NOTE — ED Provider Notes (Signed)
CSN: [id number]     Arrival date & time 06/10/14  2146 History   First MD Initiated Contact with Patient 06/10/14 2223     Chief Complaint  Patient presents with  . Flank Pain     (Consider location/radiation/quality/duration/timing/severity/associated sxs/prior Treatment) Patient is a 28 y.o. female presenting with flank pain.  Flank Pain This is a new problem. Episode onset: 4 hours ago. The problem occurs constantly. Progression since onset: Waxing and waning. Pertinent negatives include no chest pain, no abdominal pain and no shortness of breath. Associated symptoms comments: Chills without fevers. Vomiting. Urinary frequency.. Nothing aggravates the symptoms. Nothing relieves the symptoms. She has tried nothing for the symptoms.    Past Medical History  Diagnosis Date  . Gallstones 01/2008  . Pancreatitis 01/2008   Past Surgical History  Procedure Laterality Date  . Refractive surgery Bilateral   . Cholecystectomy  01/2008   Family History  Problem Relation Age of Onset  . Throat cancer Maternal Grandfather   . Hypertension Mother   . Hypertension Father   . Arthritis Maternal Grandmother   . Stroke Maternal Grandmother   . Hypertension Maternal Grandmother   . Diabetes Maternal Grandmother    History  Substance Use Topics  . Smoking status: Never Smoker   . Smokeless tobacco: Never Used  . Alcohol Use: Yes     Comment: not very much   OB History   Grav Para Term Preterm Abortions TAB SAB Ect Mult Living                 Review of Systems  Respiratory: Negative for shortness of breath.   Cardiovascular: Negative for chest pain.  Gastrointestinal: Negative for abdominal pain.  Genitourinary: Positive for flank pain.  All other systems reviewed and are negative.     Allergies  Review of patient's allergies indicates no known allergies.  Home Medications   Prior to Admission medications   Medication Sig Start Date End Date Taking? Authorizing Provider   omeprazole (PRILOSEC) 20 MG capsule Take 1 capsule (20 mg total) by mouth daily. 09/05/13   Sheliah HatchKatherine E Tabori, MD  Vitamin D, Ergocalciferol, (DRISDOL) 50000 UNITS CAPS capsule Take 1 capsule (50,000 Units total) by mouth every 7 (seven) days. 05/25/14   Sheliah HatchKatherine E Tabori, MD   BP 164/94  Pulse 99  Temp(Src) 99.1 F (37.3 C) (Oral)  Resp 18  Ht 5\' 2"  (1.575 m)  Wt 180 lb (81.647 kg)  BMI 32.91 kg/m2  SpO2 96%  LMP 06/07/2014 Physical Exam  Nursing note and vitals reviewed. Constitutional: She is oriented to person, place, and time. She appears well-developed and well-nourished. No distress.  HENT:  Head: Normocephalic and atraumatic.  Mouth/Throat: Oropharynx is clear and moist.  Eyes: Conjunctivae are normal. Pupils are equal, round, and reactive to light. No scleral icterus.  Neck: Neck supple.  Cardiovascular: Normal rate, regular rhythm, normal heart sounds and intact distal pulses.   No murmur heard. Pulmonary/Chest: Effort normal and breath sounds normal. No stridor. No respiratory distress. She has no rales.  Abdominal: Soft. Bowel sounds are normal. She exhibits no distension. There is no tenderness. There is no rigidity, no rebound and no guarding.  Musculoskeletal: Normal range of motion.  Neurological: She is alert and oriented to person, place, and time.  Skin: Skin is warm and dry. No rash noted.  Psychiatric: She has a normal mood and affect. Her behavior is normal.    ED Course  Procedures (including critical care time) Labs  Review Labs Reviewed  URINALYSIS, ROUTINE W REFLEX MICROSCOPIC - Abnormal; Notable for the following:    APPearance CLOUDY (*)    Hgb urine dipstick LARGE (*)    Protein, ur >300 (*)    Leukocytes, UA TRACE (*)    All other components within normal limits  URINE MICROSCOPIC-ADD ON - Abnormal; Notable for the following:    Squamous Epithelial / LPF FEW (*)    Bacteria, UA FEW (*)    All other components within normal limits  CBC WITH  DIFFERENTIAL - Abnormal; Notable for the following:    WBC 18.2 (*)    Hemoglobin 15.2 (*)    Neutrophils Relative % 81 (*)    Neutro Abs 14.6 (*)    All other components within normal limits  COMPREHENSIVE METABOLIC PANEL - Abnormal; Notable for the following:    Glucose, Bld 120 (*)    Creatinine, Ser 1.20 (*)    AST 38 (*)    ALT 43 (*)    GFR calc non Af Amer 61 (*)    GFR calc Af Amer 71 (*)    Anion gap 18 (*)    All other components within normal limits  PREGNANCY, URINE    Imaging Review Ct Abdomen Pelvis Wo Contrast  06/10/2014   CLINICAL DATA:  Right flank pain and diarrhea.  EXAM: CT ABDOMEN AND PELVIS WITHOUT CONTRAST  TECHNIQUE: Multidetector CT imaging of the abdomen and pelvis was performed following the standard protocol without IV contrast.  COMPARISON:  None.  FINDINGS: BODY WALL: Small fatty left inguinal hernia.  LOWER CHEST: Unremarkable.  ABDOMEN/PELVIS:  Liver: Diffuse fatty infiltration.  Biliary: Cholecystectomy.  Pancreas: Unremarkable.  Spleen: Unremarkable.  Adrenals: Unremarkable.  Kidneys and ureters: 3 mm stone near the right ureteral vesicular junction with mild hydronephrosis. No additional stone identified.  Bladder: Unremarkable.  Reproductive: Unremarkable.  Bowel: No obstruction. Normal appendix.  Retroperitoneum: No mass or adenopathy.  Peritoneum: No ascites or pneumoperitoneum.  Vascular: No acute abnormality.  OSSEOUS: No acute abnormalities.  IMPRESSION: 1. 3 mm stone near the right UVJ with mild hydronephrosis. 2. Ancillary findings noted above.   Electronically Signed   By: Tiburcio PeaJonathan  Watts M.D.   On: 06/10/2014 23:40  All radiology studies independently viewed by me.      EKG Interpretation None      MDM   Final diagnoses:  Flank pain  Right ureteral stone    Sudden onset right flank pain. No abdominal pain or abdominal tenderness. Patient feels better now, but was in severe pain and was unable to get comfortable a few hours ago.  Symptoms most consistent with nephrolithiasis.  12:08 AM CT shows 3mm stone.  Pt's pain resolved, but still throwing up.  Will treat with more nausea medicine.  If we can control her nausea, she will be ok for discharge.  She has PCP follow up tm morning already.    Warnell Foresterrey Garland Hincapie, MD 06/11/14 [phone]

## 2014-06-10 NOTE — ED Notes (Signed)
Pt c/o left flank pain x 4 days with urinary freq and pain

## 2014-06-11 ENCOUNTER — Other Ambulatory Visit (INDEPENDENT_AMBULATORY_CARE_PROVIDER_SITE_OTHER): Payer: BC Managed Care – PPO

## 2014-06-11 DIAGNOSIS — R7989 Other specified abnormal findings of blood chemistry: Secondary | ICD-10-CM

## 2014-06-11 DIAGNOSIS — R945 Abnormal results of liver function studies: Principal | ICD-10-CM

## 2014-06-11 MED ORDER — PROMETHAZINE HCL 25 MG PO TABS: 25.0000 mg | ORAL_TABLET | Freq: Two times a day (BID) | ORAL | Status: AC | PRN

## 2014-06-11 MED ORDER — PROMETHAZINE HCL 25 MG/ML IJ SOLN
12.5000 mg | Freq: Once | INTRAMUSCULAR | Status: AC
  Administered 2014-06-11: 12.5 mg via INTRAVENOUS
  Filled 2014-06-11: qty 1

## 2014-06-11 MED ORDER — KETOROLAC TROMETHAMINE 30 MG/ML IJ SOLN
30.0000 mg | Freq: Once | INTRAMUSCULAR | Status: AC
  Administered 2014-06-11: 30 mg via INTRAVENOUS
  Filled 2014-06-11: qty 1

## 2014-06-11 MED ORDER — PROMETHAZINE HCL 25 MG RE SUPP
25.0000 mg | Freq: Once | RECTAL | Status: DC
  Filled 2014-06-11: qty 1

## 2014-06-11 MED ORDER — OXYCODONE-ACETAMINOPHEN 5-325 MG PO TABS: 1.0000 | ORAL_TABLET | Freq: Four times a day (QID) | ORAL | Status: AC | PRN

## 2014-06-11 MED ORDER — ONDANSETRON HCL 4 MG/2ML IJ SOLN
4.0000 mg | Freq: Once | INTRAMUSCULAR | Status: AC
  Administered 2014-06-11: 4 mg via INTRAVENOUS
  Filled 2014-06-11: qty 2

## 2014-06-11 MED ORDER — METOCLOPRAMIDE HCL 5 MG/ML IJ SOLN
10.0000 mg | Freq: Once | INTRAMUSCULAR | Status: AC
  Administered 2014-06-11: 10 mg via INTRAVENOUS
  Filled 2014-06-11: qty 2

## 2014-06-11 NOTE — ED Notes (Signed)
MD at bedside. 

## 2014-06-11 NOTE — Discharge Instructions (Signed)
Urine Strainer °This strainer is used to catch or filter out any stones found in your urine. Place the strainer under your urine stream. Save any stones or objects that you find in your urine. Place them in a plastic or glass container to show your caregiver. The stones vary in size - some can be very small, so make sure you check the strainer carefully. Your caregiver may send the stone to the lab. When the results are back, your caregiver may recommend medicines or diet changes.  °Document Released: 05/05/2004 Document Revised: 10/23/2011 Document Reviewed: 06/12/2008 °ExitCare® Patient Information ©2015 [employer]. This information is not intended to replace advice given to you by your health care provider. Make sure you discuss any questions you have with your health care provider. ° °Kidney Stones °Kidney stones (urolithiasis) are deposits that form inside your kidneys. The intense pain is caused by the stone moving through the urinary tract. When the stone moves, the ureter goes into spasm around the stone. The stone is usually passed in the urine.  °CAUSES  °· A disorder that makes certain neck glands produce too much parathyroid hormone (primary hyperparathyroidism). °· A buildup of uric acid crystals, similar to gout in your joints. °· Narrowing (stricture) of the ureter. °· A kidney obstruction present at birth (congenital obstruction). °· Previous surgery on the kidney or ureters. °· Numerous kidney infections. °SYMPTOMS  °· Feeling sick to your stomach (nauseous). °· Throwing up (vomiting). °· Blood in the urine (hematuria). °· Pain that usually spreads (radiates) to the groin. °· Frequency or urgency of urination. °DIAGNOSIS  °· Taking a history and physical exam. °· Blood or urine tests. °· CT scan. °· Occasionally, an examination of the inside of the urinary bladder (cystoscopy) is performed. °TREATMENT  °· Observation. °· Increasing your fluid intake. °· Extracorporeal shock wave lithotripsy--This  is a noninvasive procedure that uses shock waves to break up kidney stones. °· Surgery may be needed if you have severe pain or persistent obstruction. There are various surgical procedures. Most of the procedures are performed with the use of small instruments. Only small incisions are needed to accommodate these instruments, so recovery time is minimized. °The size, location, and chemical composition are all important variables that will determine the proper choice of action for you. Talk to your health care provider to better understand your situation so that you will minimize the risk of injury to yourself and your kidney.  °HOME CARE INSTRUCTIONS  °· Drink enough water and fluids to keep your urine clear or pale yellow. This will help you to pass the stone or stone fragments. °· Strain all urine through the provided strainer. Keep all particulate matter and stones for your health care provider to see. The stone causing the pain may be as small as a grain of salt. It is very important to use the strainer each and every time you pass your urine. The collection of your stone will allow your health care provider to analyze it and verify that a stone has actually passed. The stone analysis will often identify what you can do to reduce the incidence of recurrences. °· Only take over-the-counter or prescription medicines for pain, discomfort, or fever as directed by your health care provider. °· Make a follow-up appointment with your health care provider as directed. °· Get follow-up X-rays if required. The absence of pain does not always mean that the stone has passed. It may have only stopped moving. If the urine remains completely obstructed,   it can cause loss of kidney function or even complete destruction of the kidney. It is your responsibility to make sure X-rays and follow-ups are completed. Ultrasounds of the kidney can show blockages and the status of the kidney. Ultrasounds are not associated with any  radiation and can be performed easily in a matter of minutes. °SEEK MEDICAL CARE IF: °· You experience pain that is progressive and unresponsive to any pain medicine you have been prescribed. °SEEK IMMEDIATE MEDICAL CARE IF:  °· Pain cannot be controlled with the prescribed medicine. °· You have a fever or shaking chills. °· The severity or intensity of pain increases over 18 hours and is not relieved by pain medicine. °· You develop a new onset of abdominal pain. °· You feel faint or pass out. °· You are unable to urinate. °MAKE SURE YOU:  °· Understand these instructions. °· Will watch your condition. °· Will get help right away if you are not doing well or get worse. °Document Released: 07/31/2005 Document Revised: 04/02/2013 Document Reviewed: 01/01/2013 °ExitCare® Patient Information ©2015 [employer]. This information is not intended to replace advice given to you by your health care provider. Make sure you discuss any questions you have with your health care provider. ° °

## 2014-06-12 LAB — HEPATIC FUNCTION PANEL
ALT: 41 U/L — ABNORMAL HIGH (ref 0–35)
AST: 32 U/L (ref 0–37)
Albumin: 3.5 g/dL (ref 3.5–5.2)
Alkaline Phosphatase: 47 U/L (ref 39–117)
BILIRUBIN DIRECT: 0.1 mg/dL (ref 0.0–0.3)
TOTAL PROTEIN: 7.5 g/dL (ref 6.0–8.3)
Total Bilirubin: 1.1 mg/dL (ref 0.2–1.2)

## 2014-08-20 ENCOUNTER — Ambulatory Visit: Payer: BC Managed Care – PPO | Admitting: Family Medicine

## 2015-05-13 ENCOUNTER — Telehealth: Payer: Self-pay | Admitting: Family Medicine

## 2015-05-13 NOTE — Telephone Encounter (Signed)
Patient Name: Deborah Stokes  DOB: Mar 17, 1986    Initial Comment Caller states she has a sore throat, body aches, and feeling head pressure recently. She was around family members who had the flu over the weekend. Today she has congestion and stomach pain.    Nurse Assessment  Nurse: Stefano Gaul, RN, Dwana Curd Date/Time (Eastern Time): 05/13/2015 9:59:33 AM  Confirm and document reason for call. If symptomatic, describe symptoms. ---Caller states she had a headache and sore throat on Monday. Both symptoms have gone away. Not sure if she has a fever as she has not thermometer. Does not think she has fever. She has some body aches. She has nasal congestion. Nasal congestion started yesterday. Her grandmother was diagnosed with the flu over the weekend.  Has the patient traveled out of the country within the last 30 days? ---No  Does the patient require triage? ---Yes  Related visit to physician within the last 2 weeks? ---No  Does the PT have any chronic conditions? (i.e. diabetes, asthma, etc.) ---No  Did the patient indicate they were pregnant? ---No     Guidelines    Guideline Title Affirmed Question Affirmed Notes  Influenza - Seasonal [1] Probable influenza (fever) with no complications AND [2] NOT HIGH RISK (all triage questions negative)    Final Disposition User   [facility], RN, Dwana Curd    Disagree/Comply: Comply

## 2015-05-20 ENCOUNTER — Ambulatory Visit (INDEPENDENT_AMBULATORY_CARE_PROVIDER_SITE_OTHER): Payer: BC Managed Care – PPO | Admitting: Medical

## 2015-05-20 ENCOUNTER — Encounter: Payer: Self-pay | Admitting: Medical

## 2015-05-20 VITALS — BP 130/80 | HR 103 | Temp 98.3°F | Ht 62.5 in | Wt 188.0 lb

## 2015-05-20 DIAGNOSIS — J029 Acute pharyngitis, unspecified: Secondary | ICD-10-CM

## 2015-05-20 DIAGNOSIS — R6883 Chills (without fever): Secondary | ICD-10-CM | POA: Diagnosis not present

## 2015-05-20 DIAGNOSIS — J208 Acute bronchitis due to other specified organisms: Secondary | ICD-10-CM | POA: Diagnosis not present

## 2015-05-20 DIAGNOSIS — J3089 Other allergic rhinitis: Secondary | ICD-10-CM

## 2015-05-20 MED ORDER — LORATADINE 10 MG PO TABS: 10.0000 mg | ORAL_TABLET | Freq: Every day | ORAL | Status: AC

## 2015-05-20 MED ORDER — BENZONATATE 100 MG PO CAPS
100.0000 mg | ORAL_CAPSULE | Freq: Three times a day (TID) | ORAL | Status: DC | PRN
Start: 2015-05-20 — End: 2015-07-01

## 2015-05-20 MED ORDER — AZITHROMYCIN 250 MG PO TABS: ORAL_TABLET | ORAL | Status: DC

## 2015-05-20 MED ORDER — FLUTICASONE PROPIONATE 50 MCG/ACT NA SUSP: 2.0000 | Freq: Every day | NASAL | Status: DC

## 2015-05-20 NOTE — Progress Notes (Signed)
Subjective:    Patient ID: Deborah Stokes, female    DOB: [birth year], 29 y.o.   MRN: [id number]  HPI   Pt in for feeling of cough, congestion, runny nose, and sinus pressure. PND has been felt. No sneezing and no itching eyes.   She has had runny nose and pnd.   When she blows her nose gets some colored mucous.  Above symptoms since last Friday.  LMP- Sept 18, 2016.  Mild ST.   Review of Systems  Constitutional: Positive for fatigue. Negative for fever and chills.       On Tuesday she thought she had fever. Fatigue on Tuesday.  HENT: Positive for congestion, ear pain, postnasal drip, rhinorrhea, sinus pressure and sore throat.        States some ear pressure. Worse traveling through [place].  Respiratory: Positive for cough. Negative for chest tightness, shortness of breath and wheezing.        She is coughing up some phlem.   Cardiovascular: Negative for chest pain and palpitations.  Musculoskeletal: Negative for back pain.  Neurological: Negative for dizziness and headaches.  Hematological: Negative for adenopathy. Does not bruise/bleed easily.     Past Medical History  Diagnosis Date  . Gallstones 01/2008  . Pancreatitis 01/2008    Social History   Social History  . Marital Status: Married    Spouse Name: N/A  . Number of Children: 0  . Years of Education: N/A   Occupational History  . clinical counselor [employer]   Social History Main Topics  . Smoking status: Never Smoker   . Smokeless tobacco: Never Used  . Alcohol Use: Yes     Comment: not very much  . Drug Use: No  . Sexual Activity: Yes    Birth Control/ Protection: None, Condom   Other Topics Concern  . Not on file   Social History Narrative   ** Merged History Encounter **        Past Surgical History  Procedure Laterality Date  . Refractive surgery Bilateral   . Cholecystectomy  01/2008    Family History  Problem Relation Age of Onset  . Throat cancer Maternal  Grandfather   . Hypertension Mother   . Hypertension Father   . Arthritis Maternal Grandmother   . Stroke Maternal Grandmother   . Hypertension Maternal Grandmother   . Diabetes Maternal Grandmother     No Known Allergies  Current Outpatient Prescriptions on File Prior to Visit  Medication Sig Dispense Refill  . omeprazole (PRILOSEC) 20 MG capsule Take 1 capsule (20 mg total) by mouth daily. 30 capsule 3  . oxyCODONE-acetaminophen (PERCOCET/ROXICET) 5-325 MG per tablet Take 1-2 tablets by mouth every 6 (six) hours as needed for severe pain. 15 tablet 0  . promethazine (PHENERGAN) 25 MG tablet Take 1 tablet (25 mg total) by mouth every 12 (twelve) hours as needed for nausea or vomiting. 20 tablet 0  . Vitamin D, Ergocalciferol, (DRISDOL) 50000 UNITS CAPS capsule Take 1 capsule (50,000 Units total) by mouth every 7 (seven) days. 12 capsule 0   No current facility-administered medications on file prior to visit.    BP 130/80 mmHg  Pulse 103  Temp(Src) 98.3 F (36.8 C) (Oral)  Ht 5' 2.5" (1.588 m)  Wt 188 lb (85.276 kg)  BMI 33.82 kg/m2  SpO2 99%       Objective:   Physical Exam   General  Mental Status - Alert. General Appearance - Well  groomed. Not in acute distress.  Skin Rashes- No Rashes.  HEENT Head- Normal. Ear Auditory Canal - Left- Normal. Right - Normal.Tympanic Membrane- Left- Normal. Right- Normal. Eye Sclera/Conjunctiva- Left- Normal. Right- Normal. Nose & Sinuses Nasal Mucosa- Left-  Boggy and Congested. Right-  Boggy and  Congested.Bilateral  No maxillary and  No frontal sinus pressure. Mouth & Throat Lips: Upper Lip- Normal: no dryness, cracking, pallor, cyanosis, or vesicular eruption. Lower Lip-Normal: no dryness, cracking, pallor, cyanosis or vesicular eruption. Buccal Mucosa- Bilateral- No Aphthous ulcers. Oropharynx- No Discharge or Erythema. +pnd. Tonsils: Characteristics- Bilateral- No Erythema or Congestion. Size/Enlargement- Bilateral- No  enlargement. Discharge- bilateral-None.  Neck Neck- Supple. No Masses.   Chest and Lung Exam Auscultation: Breath Sounds:-Clear even and unlabored.  Cardiovascular Auscultation:Rythm- Regular, rate and rhythm. Murmurs & Other Heart Sounds:Ausculatation of the heart reveal- No Murmurs.  Lymphatic Head & Neck General Head & Neck Lymphatics: Bilateral: Description- No Localized lymphadenopathy.       Assessment & Plan:  You appear to have started with allergy type symptom but now have some bronchitis type symptoms. Will rx flonase and claritin. Also rx of azithromycin as well.  Flonase may help with your ear pressure.  For cough rx benzonatate.  Follow up in 7 days or as needed.

## 2015-05-20 NOTE — Patient Instructions (Addendum)
You appear to have started with allergy type symptom but now have some bronchitis type symptoms. Will rx flonase and claritin. Also rx of azithromycin as well.  Flonase may help with your ear pressure.  For cough rx benzonatate.  Follow up in 7 days or as needed.

## 2015-05-20 NOTE — Progress Notes (Signed)
Pre visit review using our clinic review tool, if applicable. No additional management support is needed unless otherwise documented below in the visit note. 

## 2015-05-24 ENCOUNTER — Ambulatory Visit (INDEPENDENT_AMBULATORY_CARE_PROVIDER_SITE_OTHER): Payer: BC Managed Care – PPO | Admitting: Family Medicine

## 2015-05-24 ENCOUNTER — Telehealth: Payer: Self-pay | Admitting: Family Medicine

## 2015-05-24 ENCOUNTER — Encounter: Payer: Self-pay | Admitting: Family Medicine

## 2015-05-24 VITALS — BP 148/98 | HR 92 | Temp 97.5°F | Resp 16 | Ht 62.5 in | Wt 186.4 lb

## 2015-05-24 DIAGNOSIS — Z01419 Encounter for gynecological examination (general) (routine) without abnormal findings: Secondary | ICD-10-CM | POA: Diagnosis not present

## 2015-05-24 NOTE — Progress Notes (Signed)
Pre visit review using our clinic review tool, if applicable. No additional management support is needed unless otherwise documented below in the visit note/SLS  

## 2015-05-24 NOTE — Patient Instructions (Addendum)
Follow up 4-6 weeks to recheck BP We'll notify you of your lab results and make any changes if needed Continue to work on healthy diet and regular exercise- you can do it! We'll call you with your OB/GYN appt Call with any questions or concerns If you want to join [place] at the new [place] office, any scheduled appointments will automatically transfer and we will see you at [address] [place] [address] [place], [place] [address]  Happy Fall!!!

## 2015-05-24 NOTE — Progress Notes (Signed)
   Subjective:    Patient ID: Deborah Stokes, female    DOB: [birth year], 29 y.o.   MRN: [id number]  HPI CPE- UTD on pap (due next year).  No concerns today.  Will do GYN referral as pt is thinking about starting family in the next few years   Review of Systems Patient reports no vision/ hearing changes, adenopathy,fever, weight change,  persistant/recurrent hoarseness , swallowing issues, chest pain, palpitations, edema, persistant/recurrent cough, hemoptysis, dyspnea (rest/exertional/paroxysmal nocturnal), gastrointestinal bleeding (melena, rectal bleeding), abdominal pain, significant heartburn, bowel changes, GU symptoms (dysuria, hematuria, incontinence), Gyn symptoms (abnormal  bleeding, pain),  syncope, focal weakness, memory loss, numbness & tingling, skin/hair/nail changes, abnormal bruising or bleeding, anxiety, or depression.     Objective:   Physical Exam  General Appearance:    Alert, cooperative, no distress, appears stated age  Head:    Normocephalic, without obvious abnormality, atraumatic  Eyes:    PERRL, conjunctiva/corneas clear, EOM's intact, fundi    benign, both eyes  Ears:    Normal TM's and external ear canals, both ears  Nose:   Nares normal, septum midline, mucosa normal, no drainage    or sinus tenderness  Throat:   Lips, mucosa, and tongue normal; teeth and gums normal  Neck:   Supple, symmetrical, trachea midline, no adenopathy;    Thyroid: no enlargement/tenderness/nodules  Back:     Symmetric, no curvature, ROM normal, no CVA tenderness  Lungs:     Clear to auscultation bilaterally, respirations unlabored  Chest Wall:    No tenderness or deformity   Heart:    Regular rate and rhythm, S1 and S2 normal, no murmur, rub   or gallop  Breast Exam:    No tenderness, masses, or nipple abnormality  Abdomen:     Soft, non-tender, bowel sounds active all four quadrants,    no masses, no organomegaly  Genitalia:    Deferred to GYN.  Rectal:    Extremities:    Extremities normal, atraumatic, no cyanosis or edema  Pulses:   2+ and symmetric all extremities  Skin:   Skin color, texture, turgor normal, no rashes or lesions  Lymph nodes:   Cervical, supraclavicular, and axillary nodes normal  Neurologic:   CNII-XII intact, normal strength, sensation and reflexes    throughout          Assessment & Plan:

## 2015-05-24 NOTE — Assessment & Plan Note (Signed)
Pt's PE WNL w/ exception of obesity and elevated BP.  Pt requesting OB/GYN referral for upcoming pap as they are in the family planning stages- referral provided.  Stressed need for healthy diet, regular exercise, low Na intake, and increased water due to elevated BP.  Will follow in 4-6 weeks.  Check labs.  Anticipatory guidance provided.

## 2015-05-24 NOTE — Telephone Encounter (Signed)
LMOM with contact name and number for return call RE: follow-up appointment for BP recheck; please schedule pt in 4:00 slot for this when she returns call/SLS Thanks!

## 2015-05-24 NOTE — Telephone Encounter (Signed)
°  Relation to ZO:XWRU Call back number:[phone]   Reason for call:  As per AVS follow up in 4 to 6 weeks patient would like a 4pm appointment slot. Please advise

## 2015-05-25 LAB — BASIC METABOLIC PANEL
BUN: 11 mg/dL (ref 6–23)
CALCIUM: 10.9 mg/dL — AB (ref 8.4–10.5)
CO2: 24 mEq/L (ref 19–32)
Chloride: 103 mEq/L (ref 96–112)
Creatinine, Ser: 0.88 mg/dL (ref 0.40–1.20)
GFR: 80.63 mL/min (ref >60.00)
Glucose, Bld: 81 mg/dL (ref 70–99)
Potassium: 4.5 mEq/L (ref 3.5–5.1)
Sodium: 137 mEq/L (ref 135–145)

## 2015-05-25 LAB — CBC WITH DIFFERENTIAL/PLATELET
BASOS ABS: 0 10*3/uL (ref 0.0–0.1)
BASOS PCT: 0.3 % (ref 0.0–3.0)
Eosinophils Absolute: 0.2 10*3/uL (ref 0.0–0.7)
Eosinophils Relative: 1.8 % (ref 0.0–5.0)
HEMATOCRIT: 45.8 % (ref 36.0–46.0)
HEMOGLOBIN: 15.2 g/dL — AB (ref 12.0–15.0)
LYMPHS PCT: 34.2 % (ref 12.0–46.0)
Lymphs Abs: 3.8 10*3/uL (ref 0.7–4.0)
MCHC: 33.3 g/dL (ref 30.0–36.0)
MCV: 87.7 fl (ref 78.0–100.0)
MONO ABS: 0.3 10*3/uL (ref 0.1–1.0)
Monocytes Relative: 3.1 % (ref 3.0–12.0)
Neutro Abs: 6.7 10*3/uL (ref 1.4–7.7)
Neutrophils Relative %: 60.6 % (ref 43.0–77.0)
Platelets: 414 10*3/uL — ABNORMAL HIGH (ref 150.0–400.0)
RBC: 5.22 Mil/uL — AB (ref 3.87–5.11)
RDW: 12.5 % (ref 11.5–15.5)
WBC: 11.1 10*3/uL — AB (ref 4.0–10.5)

## 2015-05-25 LAB — LIPID PANEL
CHOL/HDL RATIO: 5
CHOLESTEROL: 228 mg/dL — AB (ref 0–200)
HDL: 45.7 mg/dL (ref >39.00)
NONHDL: 182.66
Triglycerides: 327 mg/dL — ABNORMAL HIGH (ref 0.0–149.0)
VLDL: 65.4 mg/dL — ABNORMAL HIGH (ref 0.0–40.0)

## 2015-05-25 LAB — HEPATIC FUNCTION PANEL
ALT: 101 U/L — ABNORMAL HIGH (ref 0–35)
AST: 36 U/L (ref 0–37)
Albumin: 4.3 g/dL (ref 3.5–5.2)
Alkaline Phosphatase: 55 U/L (ref 39–117)
BILIRUBIN TOTAL: 0.3 mg/dL (ref 0.2–1.2)
Bilirubin, Direct: 0 mg/dL (ref 0.0–0.3)
Total Protein: 8.1 g/dL (ref 6.0–8.3)

## 2015-05-25 LAB — LDL CHOLESTEROL, DIRECT: LDL DIRECT: 153 mg/dL

## 2015-05-25 LAB — VITAMIN D 25 HYDROXY (VIT D DEFICIENCY, FRACTURES): VITD: 15.91 ng/mL — AB (ref 30.00–100.00)

## 2015-05-25 LAB — TSH: TSH: 2.44 u[IU]/mL (ref 0.35–4.50)

## 2015-05-25 NOTE — Telephone Encounter (Signed)
Patient scheduled with provider 07/01/2015 at 4pm

## 2015-05-31 ENCOUNTER — Other Ambulatory Visit: Payer: Self-pay | Admitting: General Practice

## 2015-05-31 MED ORDER — VITAMIN D (ERGOCALCIFEROL) 1.25 MG (50000 UNIT) PO CAPS: 50000.0000 [IU] | ORAL_CAPSULE | ORAL | Status: AC

## 2015-06-01 ENCOUNTER — Other Ambulatory Visit: Payer: Self-pay | Admitting: Family Medicine

## 2015-06-01 ENCOUNTER — Encounter: Payer: Self-pay | Admitting: General Practice

## 2015-06-01 DIAGNOSIS — R945 Abnormal results of liver function studies: Secondary | ICD-10-CM

## 2015-06-01 DIAGNOSIS — R7989 Other specified abnormal findings of blood chemistry: Secondary | ICD-10-CM

## 2015-07-01 ENCOUNTER — Ambulatory Visit (INDEPENDENT_AMBULATORY_CARE_PROVIDER_SITE_OTHER): Payer: BC Managed Care – PPO | Admitting: Family Medicine

## 2015-07-01 ENCOUNTER — Encounter: Payer: Self-pay | Admitting: Family Medicine

## 2015-07-01 VITALS — BP 140/90 | HR 84 | Temp 98.1°F | Resp 16 | Ht 63.0 in | Wt 178.0 lb

## 2015-07-01 DIAGNOSIS — R030 Elevated blood-pressure reading, without diagnosis of hypertension: Secondary | ICD-10-CM

## 2015-07-01 DIAGNOSIS — R945 Abnormal results of liver function studies: Secondary | ICD-10-CM

## 2015-07-01 DIAGNOSIS — Z23 Encounter for immunization: Secondary | ICD-10-CM

## 2015-07-01 DIAGNOSIS — R7989 Other specified abnormal findings of blood chemistry: Secondary | ICD-10-CM | POA: Diagnosis not present

## 2015-07-01 DIAGNOSIS — IMO0001 Reserved for inherently not codable concepts without codable children: Secondary | ICD-10-CM

## 2015-07-01 NOTE — Progress Notes (Signed)
   Subjective:    Patient ID: Deborah Stokes, female    DOB: 10/14/1985, 29 y.o.   MRN: [id number]  HPI Elevated BP- pt's SBP and DBP have dropped by 8 points.  148/98--> 140/90.  Pt reports being extremely stressed w/ work.  Pt has lost 8 lbs in 1 month.  No CP, SOB, HAs, visual changes, edema.  Elevated LFT- pt due for repeat testing.  Mildly elevated Ca- due for PTH testing.   Review of Systems For ROS see HPI     Objective:   Physical Exam  Constitutional: She is oriented to person, place, and time. She appears well-developed and well-nourished. No distress.  overweight  HENT:  Head: Normocephalic and atraumatic.  Eyes: Conjunctivae and EOM are normal. Pupils are equal, round, and reactive to light.  Neck: Normal range of motion. Neck supple. No thyromegaly present.  Cardiovascular: Normal rate, regular rhythm, normal heart sounds and intact distal pulses.   No murmur heard. Pulmonary/Chest: Effort normal and breath sounds normal. No respiratory distress.  Abdominal: Soft. She exhibits no distension. There is no tenderness.  Musculoskeletal: She exhibits no edema.  Lymphadenopathy:    She has no cervical adenopathy.  Neurological: She is alert and oriented to person, place, and time.  Skin: Skin is warm and dry.  Psychiatric: She has a normal mood and affect. Her behavior is normal.  Vitals reviewed.         Assessment & Plan:

## 2015-07-01 NOTE — Patient Instructions (Signed)
Follow up in 3 months to recheck BP Continue to work on healthy diet and regular exercise- this will improve stress and blood pressure Drink plenty of water!!! Low salt diet! Call with any questions or concerns If you want to join us at the new [place] office, any scheduled appointments will automatically transfer and we will see you at [address] [place], [place], [place] [id number] (OPENING 08/17/15) Happy Holidays!!!

## 2015-07-01 NOTE — Assessment & Plan Note (Signed)
New.  Noted on last labs.  Due for repeat Ca and PTH.  Labs collected.  Will determine next steps based on results.

## 2015-07-01 NOTE — Progress Notes (Signed)
Pre visit review using our clinic review tool, if applicable. No additional management support is needed unless otherwise documented below in the visit note. 

## 2015-07-01 NOTE — Assessment & Plan Note (Signed)
New.  Noted on last labs.  Pt is w/o gallbladder.  Does not drink ETOH.  Repeat LFTs today.  If still elevated, will get US.

## 2015-07-01 NOTE — Assessment & Plan Note (Signed)
New.  Pt's BP is better than last visit but still elevated for her age.  Stressed need for low salt diet, regular exercise, weight loss, and stress management.  No meds at this time but will continue to follow.

## 2015-07-02 ENCOUNTER — Encounter: Payer: Self-pay | Admitting: General Practice

## 2015-07-02 LAB — HEPATIC FUNCTION PANEL
ALT: 44 U/L — AB (ref 0–35)
AST: 29 U/L (ref 0–37)
Albumin: 4.5 g/dL (ref 3.5–5.2)
Alkaline Phosphatase: 50 U/L (ref 39–117)
BILIRUBIN DIRECT: 0.2 mg/dL (ref 0.0–0.3)
Total Bilirubin: 1 mg/dL (ref 0.2–1.2)
Total Protein: 8.1 g/dL (ref 6.0–8.3)

## 2015-07-05 LAB — PTH, INTACT AND CALCIUM
Calcium: 9.4 mg/dL (ref 8.4–10.5)
PTH: 43 pg/mL (ref 14–64)

## 2016-04-27 IMAGING — CT CT ABD-PELV W/O CM
2 of 4 series · 17 of 46 positions shown, 19 images · non-contrast
Comparison: None.

CLINICAL DATA: Right flank pain and diarrhea.

EXAM:
CT ABDOMEN AND PELVIS WITHOUT CONTRAST
TECHNIQUE: Multidetector CT imaging of the abdomen and pelvis was performed
following the standard protocol without IV contrast.

[Series 2: renal stone > 200 lbs 5.0 b31f · axial · 0.79mm/px · z∈[-262,+128]mm · 14 of 86 slices shown, 16 images]
[im 4/86  soft-tissue]
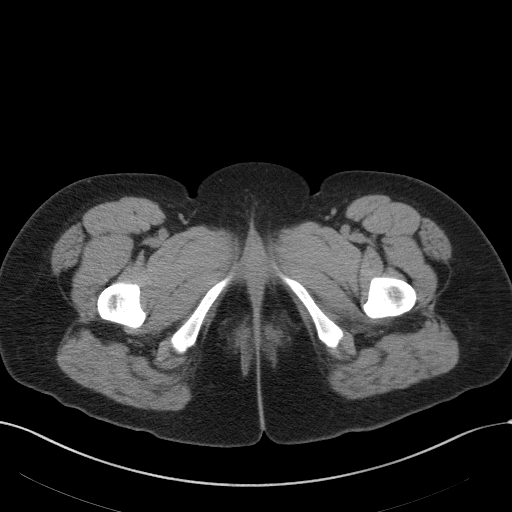
[im 4/86  bone]
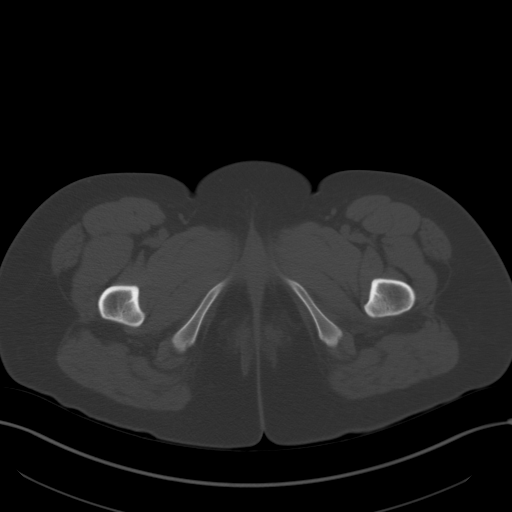
[im 12/86  soft-tissue]
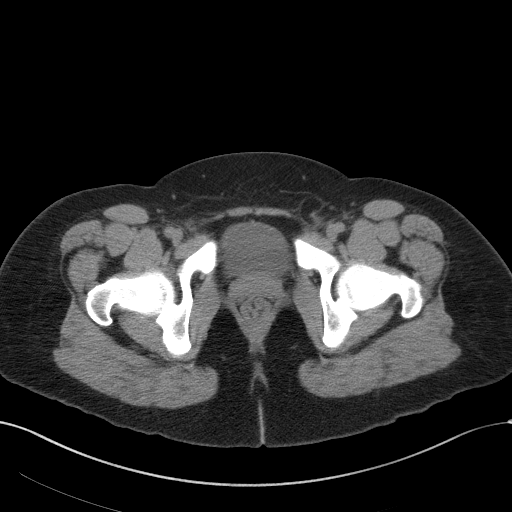
[im 16/86  soft-tissue]
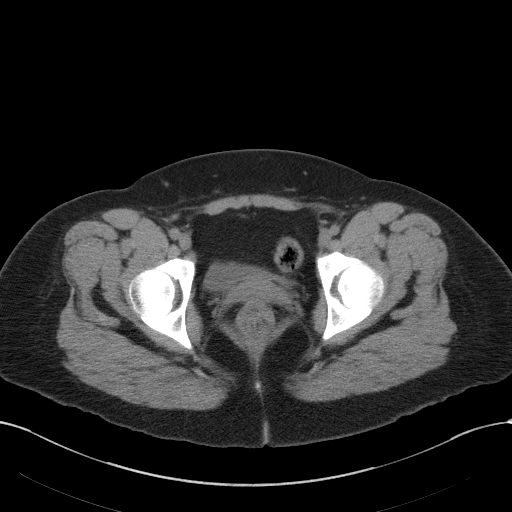
[im 24/86  soft-tissue]
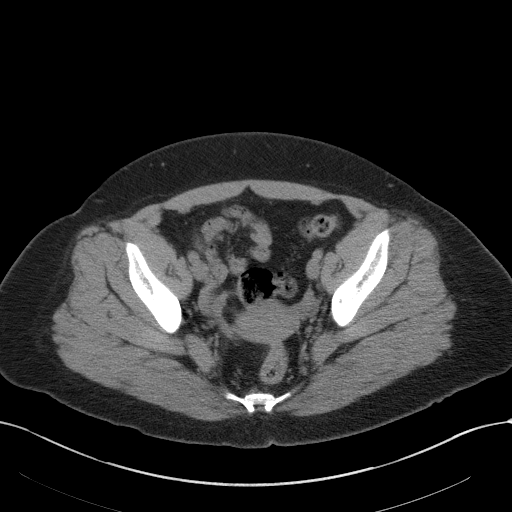
[im 28/86  soft-tissue]
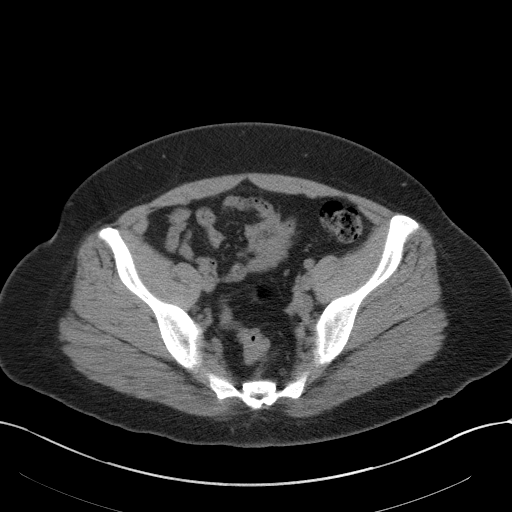
[im 35/86  soft-tissue]
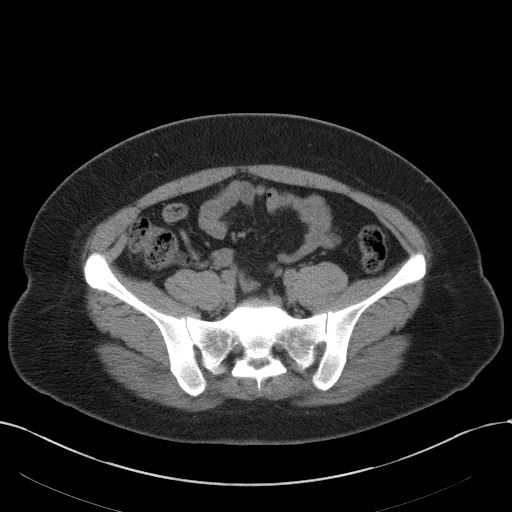
[im 39/86  soft-tissue]
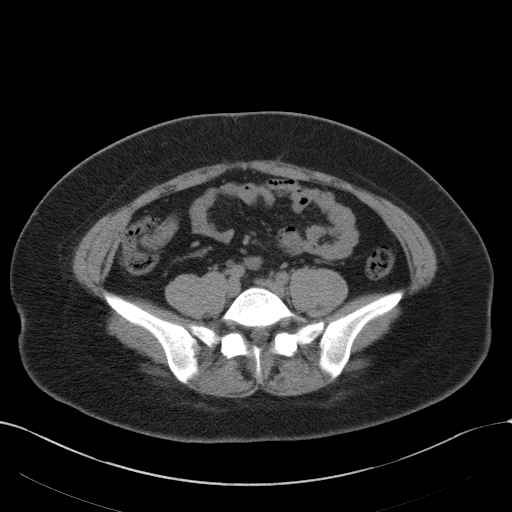
[im 47/86  soft-tissue]
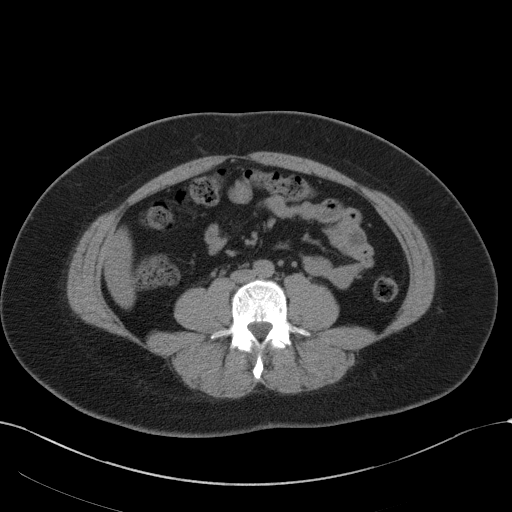
[im 51/86  soft-tissue]
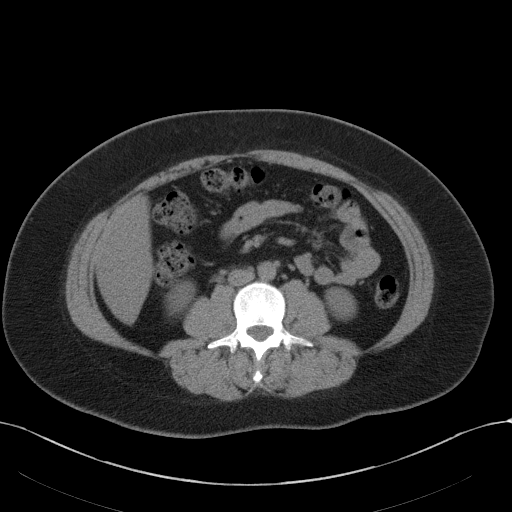
[im 51/86  bone]
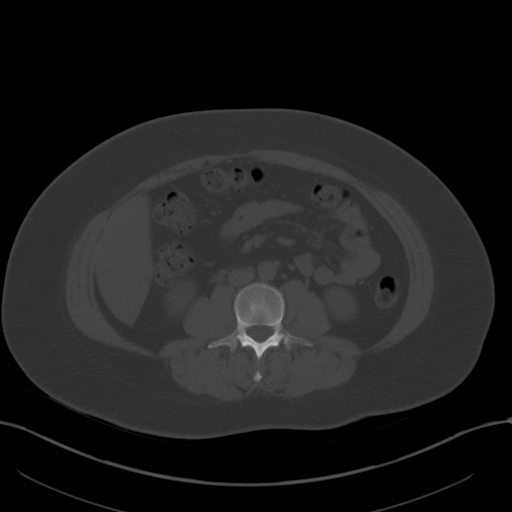
[im 58/86  soft-tissue]
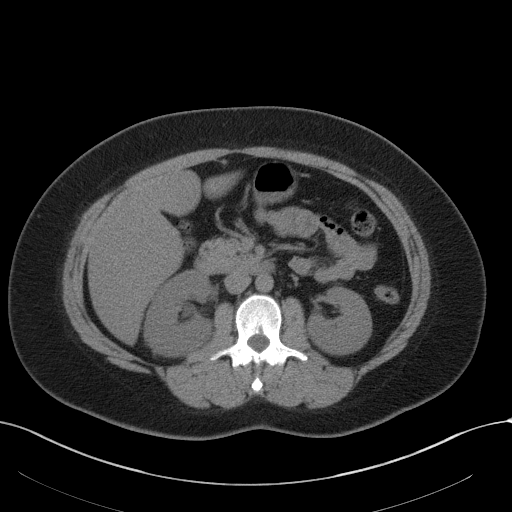
[im 62/86  soft-tissue]
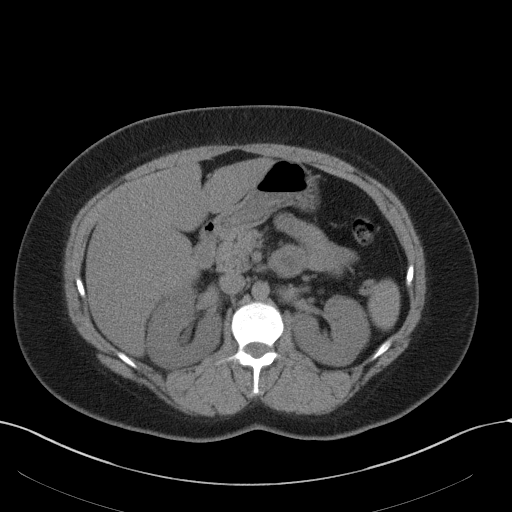
[im 70/86  soft-tissue]
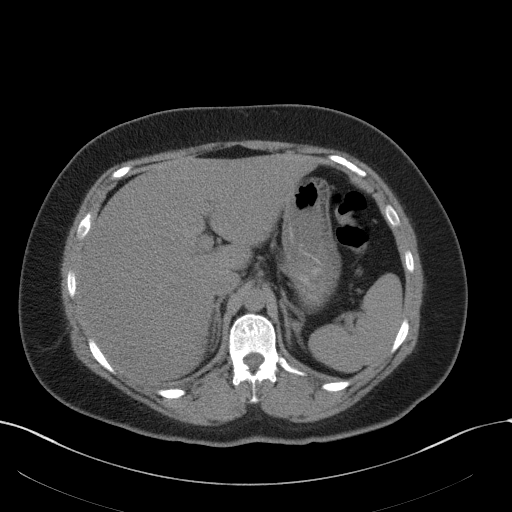
[im 74/86  soft-tissue]
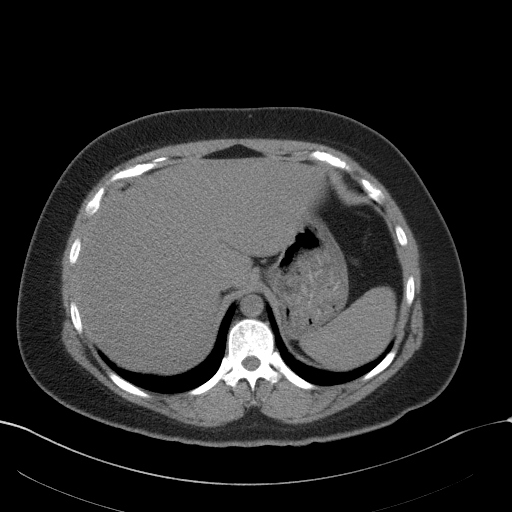
[im 82/86  soft-tissue]
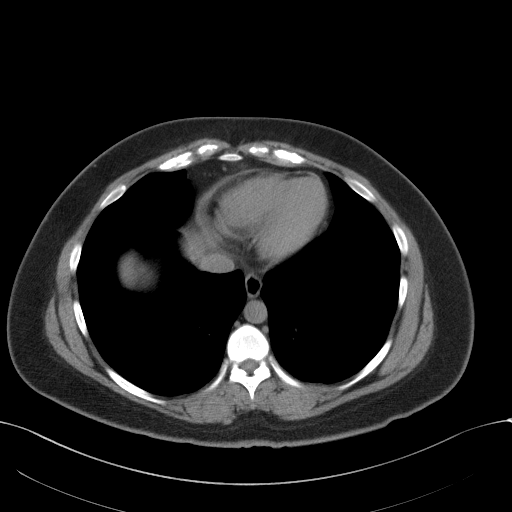

[Series 5: renal stone 3.0 coronal · coronal · 0.78mm/px · 3 of 88 slices shown]
[im 30/88  soft-tissue]
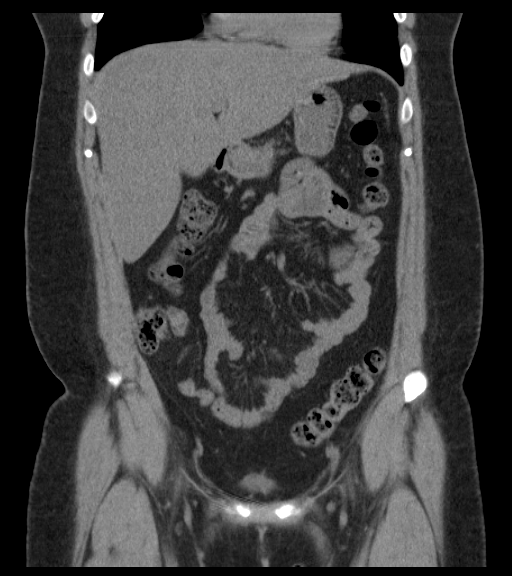
[im 39/88  soft-tissue]
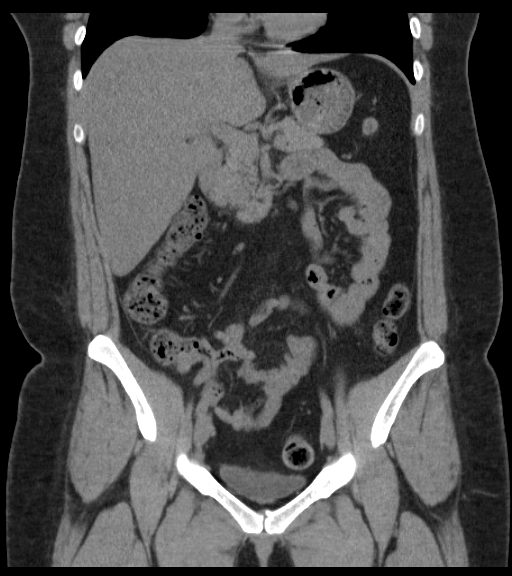
[im 49/88  soft-tissue]
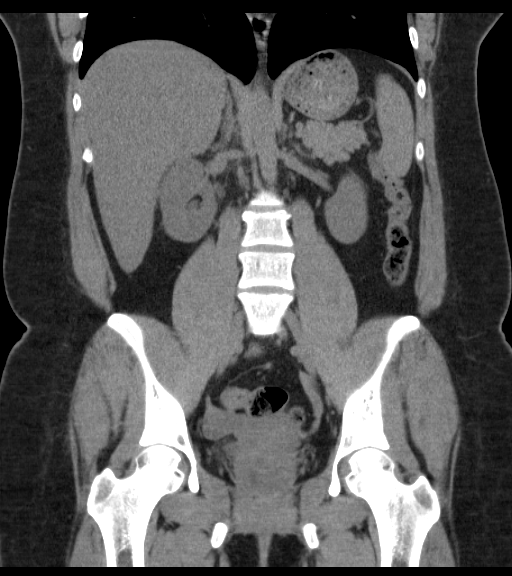

[17 of 46 positions shown; findings below may reference images not displayed]

FINDINGS: BODY WALL: Small fatty left inguinal hernia.

LOWER CHEST: Unremarkable.

ABDOMEN/PELVIS:

Liver: Diffuse fatty infiltration.

Biliary: Cholecystectomy.

Pancreas: Unremarkable.

Spleen: Unremarkable.

Adrenals: Unremarkable.

Kidneys and ureters: 3 mm stone near the right ureteral vesicular
junction with mild hydronephrosis. No additional stone identified.

Bladder: Unremarkable.

Reproductive: Unremarkable.

Bowel: No obstruction. Normal appendix.

Retroperitoneum: No mass or adenopathy.

Peritoneum: No ascites or pneumoperitoneum.

Vascular: No acute abnormality.

OSSEOUS: No acute abnormalities.
IMPRESSION: 1. 3 mm stone near the right UVJ with mild hydronephrosis.
2. Ancillary findings noted above.

## 2018-01-01 ENCOUNTER — Encounter: Payer: Self-pay | Admitting: General Practice
